# Patient Record
Sex: Female | Born: 1975 | Race: Black or African American | Hispanic: No | Marital: Single | Smoking: Current some day smoker
Health system: Southern US, Community
[De-identification: ages and names within clinical notes are randomized; demographics above are authoritative.]

## PROBLEM LIST (undated history)

## (undated) ENCOUNTER — Inpatient Hospital Stay (HOSPITAL_COMMUNITY): Payer: Self-pay

## (undated) DIAGNOSIS — J45909 Unspecified asthma, uncomplicated: Secondary | ICD-10-CM

## (undated) DIAGNOSIS — F209 Schizophrenia, unspecified: Secondary | ICD-10-CM

## (undated) DIAGNOSIS — F1910 Other psychoactive substance abuse, uncomplicated: Secondary | ICD-10-CM

## (undated) DIAGNOSIS — F319 Bipolar disorder, unspecified: Secondary | ICD-10-CM

## (undated) HISTORY — DX: Unspecified asthma, uncomplicated: J45.909

---

## 1998-01-12 ENCOUNTER — Inpatient Hospital Stay (HOSPITAL_COMMUNITY): Admission: AD | Admit: 1998-01-12 | Discharge: 1998-01-12 | Payer: Self-pay | Admitting: *Deleted

## 1998-05-31 ENCOUNTER — Inpatient Hospital Stay (HOSPITAL_COMMUNITY): Admission: AD | Admit: 1998-05-31 | Discharge: 1998-05-31 | Payer: Self-pay | Admitting: *Deleted

## 1998-06-03 ENCOUNTER — Inpatient Hospital Stay (HOSPITAL_COMMUNITY): Admission: AD | Admit: 1998-06-03 | Discharge: 1998-06-03 | Payer: Self-pay | Admitting: *Deleted

## 1998-06-12 ENCOUNTER — Inpatient Hospital Stay (HOSPITAL_COMMUNITY): Admission: AD | Admit: 1998-06-12 | Discharge: 1998-06-12 | Payer: Self-pay | Admitting: Obstetrics & Gynecology

## 1999-01-29 ENCOUNTER — Encounter: Payer: Self-pay | Admitting: Emergency Medicine

## 1999-01-29 ENCOUNTER — Emergency Department (HOSPITAL_COMMUNITY): Admission: EM | Admit: 1999-01-29 | Discharge: 1999-01-29 | Payer: Self-pay | Admitting: Emergency Medicine

## 1999-02-25 ENCOUNTER — Emergency Department (HOSPITAL_COMMUNITY): Admission: EM | Admit: 1999-02-25 | Discharge: 1999-02-25 | Payer: Self-pay | Admitting: Emergency Medicine

## 1999-07-31 ENCOUNTER — Emergency Department (HOSPITAL_COMMUNITY): Admission: EM | Admit: 1999-07-31 | Discharge: 1999-07-31 | Payer: Self-pay | Admitting: Emergency Medicine

## 1999-10-02 ENCOUNTER — Inpatient Hospital Stay (HOSPITAL_COMMUNITY): Admission: EM | Admit: 1999-10-02 | Discharge: 1999-10-08 | Payer: Self-pay | Admitting: *Deleted

## 2000-03-05 ENCOUNTER — Emergency Department (HOSPITAL_COMMUNITY): Admission: EM | Admit: 2000-03-05 | Discharge: 2000-03-05 | Payer: Self-pay | Admitting: Emergency Medicine

## 2000-08-11 ENCOUNTER — Emergency Department (HOSPITAL_COMMUNITY): Admission: EM | Admit: 2000-08-11 | Discharge: 2000-08-11 | Payer: Self-pay | Admitting: Emergency Medicine

## 2000-09-08 ENCOUNTER — Encounter: Payer: Self-pay | Admitting: Obstetrics & Gynecology

## 2000-09-08 ENCOUNTER — Inpatient Hospital Stay (HOSPITAL_COMMUNITY): Admission: AD | Admit: 2000-09-08 | Discharge: 2000-09-08 | Payer: Self-pay | Admitting: *Deleted

## 2000-09-11 ENCOUNTER — Inpatient Hospital Stay (HOSPITAL_COMMUNITY): Admission: AD | Admit: 2000-09-11 | Discharge: 2000-09-11 | Payer: Self-pay | Admitting: Obstetrics & Gynecology

## 2000-10-26 ENCOUNTER — Inpatient Hospital Stay (HOSPITAL_COMMUNITY): Admission: AD | Admit: 2000-10-26 | Discharge: 2000-10-26 | Payer: Self-pay | Admitting: Obstetrics

## 2002-01-05 ENCOUNTER — Emergency Department (HOSPITAL_COMMUNITY): Admission: EM | Admit: 2002-01-05 | Discharge: 2002-01-05 | Payer: Self-pay | Admitting: Emergency Medicine

## 2002-01-29 ENCOUNTER — Inpatient Hospital Stay (HOSPITAL_COMMUNITY): Admission: EM | Admit: 2002-01-29 | Discharge: 2002-02-06 | Payer: Self-pay | Admitting: Psychiatry

## 2002-08-08 ENCOUNTER — Inpatient Hospital Stay (HOSPITAL_COMMUNITY): Admission: EM | Admit: 2002-08-08 | Discharge: 2002-08-21 | Payer: Self-pay | Admitting: Psychiatry

## 2002-12-04 ENCOUNTER — Inpatient Hospital Stay (HOSPITAL_COMMUNITY): Admission: EM | Admit: 2002-12-04 | Discharge: 2002-12-17 | Payer: Self-pay | Admitting: Psychiatry

## 2010-10-25 HISTORY — PX: BREAST LUMPECTOMY: SHX2

## 2012-03-20 ENCOUNTER — Other Ambulatory Visit: Payer: Self-pay | Admitting: Cardiology

## 2012-06-18 ENCOUNTER — Emergency Department (HOSPITAL_COMMUNITY)
Admission: EM | Admit: 2012-06-18 | Discharge: 2012-06-18 | Disposition: A | Discharge status: Home / Self Care | Payer: Medicare Other | Attending: Emergency Medicine | Admitting: Emergency Medicine

## 2012-06-18 ENCOUNTER — Encounter (HOSPITAL_COMMUNITY): Payer: Self-pay | Admitting: *Deleted

## 2012-06-18 DIAGNOSIS — F319 Bipolar disorder, unspecified: Secondary | ICD-10-CM | POA: Insufficient documentation

## 2012-06-18 DIAGNOSIS — F17200 Nicotine dependence, unspecified, uncomplicated: Secondary | ICD-10-CM | POA: Insufficient documentation

## 2012-06-18 DIAGNOSIS — F209 Schizophrenia, unspecified: Secondary | ICD-10-CM

## 2012-06-18 HISTORY — DX: Bipolar disorder, unspecified: F31.9

## 2012-06-18 HISTORY — DX: Schizophrenia, unspecified: F20.9

## 2012-06-18 LAB — COMPREHENSIVE METABOLIC PANEL
Albumin: 3.4 g/dL — ABNORMAL LOW (ref 3.5–5.2)
Alkaline Phosphatase: 97 U/L (ref 39–117)
BUN: 5 mg/dL — ABNORMAL LOW (ref 6–23)
CO2: 26 mEq/L (ref 19–32)
Chloride: 103 mEq/L (ref 96–112)
Creatinine, Ser: 1.09 mg/dL (ref 0.50–1.10)
GFR calc non Af Amer: 65 mL/min — ABNORMAL LOW (ref >90)
Glucose, Bld: 87 mg/dL (ref 70–99)
Potassium: 3.4 mEq/L — ABNORMAL LOW (ref 3.5–5.1)
Total Bilirubin: 0.5 mg/dL (ref 0.3–1.2)

## 2012-06-18 LAB — RAPID URINE DRUG SCREEN, HOSP PERFORMED
Barbiturates: NOT DETECTED
Benzodiazepines: NOT DETECTED

## 2012-06-18 LAB — CBC
HCT: 37.1 % (ref 36.0–46.0)
Hemoglobin: 12.9 g/dL (ref 12.0–15.0)
MCH: 30.5 pg (ref 26.0–34.0)
MCHC: 34.8 g/dL (ref 30.0–36.0)
MCV: 87.7 fL (ref 78.0–100.0)
Platelets: 186 10*3/uL (ref 150–400)
RBC: 4.23 MIL/uL (ref 3.87–5.11)
RDW: 13.3 % (ref 11.5–15.5)
WBC: 5.7 10*3/uL (ref 4.0–10.5)

## 2012-06-18 LAB — ACETAMINOPHEN LEVEL: Acetaminophen (Tylenol), Serum: <15 ug/mL (ref 10–30)

## 2012-06-18 LAB — ETHANOL: Alcohol, Ethyl (B): <11 mg/dL (ref 0–11)

## 2012-06-18 MED ORDER — ONDANSETRON HCL 4 MG PO TABS: 4.0000 mg | ORAL_TABLET | Freq: Three times a day (TID) | ORAL | Status: DC | PRN

## 2012-06-18 MED ORDER — ALUM & MAG HYDROXIDE-SIMETH 200-200-20 MG/5ML PO SUSP: 30.0000 mL | ORAL | Status: DC | PRN

## 2012-06-18 MED ORDER — ZOLPIDEM TARTRATE 5 MG PO TABS: 5.0000 mg | ORAL_TABLET | Freq: Every evening | ORAL | Status: DC | PRN

## 2012-06-18 MED ORDER — NICOTINE 21 MG/24HR TD PT24
21.0000 mg | MEDICATED_PATCH | Freq: Every day | TRANSDERMAL | Status: DC
  Administered 2012-06-18: 21 mg via TRANSDERMAL
  Filled 2012-06-18: qty 1

## 2012-06-18 MED ORDER — LORAZEPAM 1 MG PO TABS: 1.0000 mg | ORAL_TABLET | Freq: Three times a day (TID) | ORAL | Status: DC | PRN

## 2012-06-18 MED ORDER — IBUPROFEN 200 MG PO TABS: 600.0000 mg | ORAL_TABLET | Freq: Three times a day (TID) | ORAL | Status: DC | PRN

## 2012-06-18 MED ORDER — ACETAMINOPHEN 325 MG PO TABS: 650.0000 mg | ORAL_TABLET | ORAL | Status: DC | PRN

## 2012-06-18 NOTE — ED Provider Notes (Signed)
Medical screening examination/treatment/procedure(s) were performed by non-physician practitioner and as supervising physician I was immediately available for consultation/collaboration.  Krupa Stege, MD 06/18/12 1521 

## 2012-06-18 NOTE — ED Notes (Signed)
Psychiatric Consult Request form faxed to [facility]. Spoke w/Kyle to confirm fax and verify information. Dr. Jetty Peeks is oncall and will give a call back within the hour per [facility].

## 2012-06-18 NOTE — ED Provider Notes (Signed)
Cassidy Thomas is a 36 y.o. female who presented under IVC. She has been seen by [facility], who feels that she has paranoid schizophrenia, but is stable and the she can be followed up as usual with her psychiatrist in the outpatient setting.     Flint Melter, MD 06/18/12 1729

## 2012-06-18 NOTE — ED Notes (Signed)
Pt denies reasons listed on IVC papers and does not know why the police brought her in.

## 2012-06-18 NOTE — ED Provider Notes (Signed)
History     CSN: [id number]  Arrival date & time 06/18/12  0102   First MD Initiated Contact with Patient 06/18/12 1045      Chief Complaint  Patient presents with  . Medical Clearance    (Consider location/radiation/quality/duration/timing/severity/associated sxs/prior treatment) HPI Patient is IVC'ed for psychosis. The patient denies any issues to me and denies any hallucinations. The patient denies suicidal or homicidal ideations.  Past Medical History  Diagnosis Date  . Bipolar 1 disorder   . Schizophrenia     History reviewed. No pertinent past surgical history.  No family history on file.  History  Substance Use Topics  . Smoking status: Current Everyday Smoker  . Smokeless tobacco: Not on file  . Alcohol Use: No    OB History    Grav Para Term Preterm Abortions TAB SAB Ect Mult Living                  Review of Systems All other systems negative except as documented in the HPI. All pertinent positives and negatives as reviewed in the HPI.  Allergies  Review of patient's allergies indicates no known allergies.  Home Medications   Current Outpatient Rx  Name Route Sig Dispense Refill  . DIVALPROEX SODIUM ER 500 MG PO TB24 Oral Take 2,000 mg by mouth at bedtime.    Marland Kitchen ESOMEPRAZOLE MAGNESIUM 40 MG PO CPDR Oral Take 40 mg by mouth daily before breakfast.    . LEVOTHYROXINE SODIUM 75 MCG PO TABS Oral Take 75 mcg by mouth daily.    Marland Kitchen PALIPERIDONE PALMITATE 156 MG/ML IM SUSP Intramuscular Inject 156 mg into the muscle every 30 (thirty) days. Last filled 04-04-12 at pt's pharmacy. Pt states she received samples from md office      BP 123/81  Pulse 81  Temp 98 F (36.7 C) (Oral)  Resp 16  SpO2 97%  LMP 06/12/2012  Physical Exam  Nursing note and vitals reviewed. Constitutional: She is oriented to person, place, and time. She appears well-developed and well-nourished. No distress.  HENT:  Head: Normocephalic and atraumatic.  Mouth/Throat: Oropharynx is  clear and moist.  Eyes: Pupils are equal, round, and reactive to light.  Neck: Normal range of motion. Neck supple.  Cardiovascular: Normal rate and regular rhythm.  Exam reveals no gallop and no friction rub.   No murmur heard. Pulmonary/Chest: Effort normal and breath sounds normal. No respiratory distress.  Neurological: She is alert and oriented to person, place, and time.  Skin: Skin is warm and dry.  Psychiatric: She has a normal mood and affect. Her behavior is normal.    ED Course  Procedures (including critical care time)  Labs Reviewed  COMPREHENSIVE METABOLIC PANEL - Abnormal; Notable for the following:    Potassium 3.4 (*)     BUN 5 (*)     Albumin 3.4 (*)     GFR calc non Af Amer 65 (*)     GFR calc Af Amer 76 (*)     All other components within normal limits  URINE RAPID DRUG SCREEN (HOSP PERFORMED) - Abnormal; Notable for the following:    Cocaine POSITIVE (*)     Tetrahydrocannabinol POSITIVE (*)     All other components within normal limits  CBC  ETHANOL  ACETAMINOPHEN LEVEL   Patient to be assessed by the ACT team.    MDM         Carlyle Dolly, PA-C 06/18/12 1514

## 2012-06-18 NOTE — ED Notes (Signed)
Discharged paperwork reviewed with pt. She verbalized understanding of instructions and the importance of f/u with her outpt psychiatrist this week. Pt denies any pain S/HI at this time.

## 2012-10-02 ENCOUNTER — Encounter (HOSPITAL_COMMUNITY): Payer: Self-pay | Admitting: Emergency Medicine

## 2012-10-02 ENCOUNTER — Emergency Department (HOSPITAL_COMMUNITY)
Admission: EM | Admit: 2012-10-02 | Discharge: 2012-10-02 | Disposition: A | Discharge status: Home / Self Care | Payer: Medicare Other | Attending: Emergency Medicine | Admitting: Emergency Medicine

## 2012-10-02 DIAGNOSIS — Z79899 Other long term (current) drug therapy: Secondary | ICD-10-CM | POA: Insufficient documentation

## 2012-10-02 DIAGNOSIS — F209 Schizophrenia, unspecified: Secondary | ICD-10-CM | POA: Insufficient documentation

## 2012-10-02 DIAGNOSIS — N739 Female pelvic inflammatory disease, unspecified: Secondary | ICD-10-CM

## 2012-10-02 DIAGNOSIS — Z202 Contact with and (suspected) exposure to infections with a predominantly sexual mode of transmission: Secondary | ICD-10-CM | POA: Insufficient documentation

## 2012-10-02 DIAGNOSIS — F17200 Nicotine dependence, unspecified, uncomplicated: Secondary | ICD-10-CM | POA: Insufficient documentation

## 2012-10-02 DIAGNOSIS — Z3202 Encounter for pregnancy test, result negative: Secondary | ICD-10-CM | POA: Insufficient documentation

## 2012-10-02 DIAGNOSIS — F319 Bipolar disorder, unspecified: Secondary | ICD-10-CM | POA: Insufficient documentation

## 2012-10-02 DIAGNOSIS — Z711 Person with feared health complaint in whom no diagnosis is made: Secondary | ICD-10-CM

## 2012-10-02 DIAGNOSIS — N760 Acute vaginitis: Secondary | ICD-10-CM

## 2012-10-02 LAB — WET PREP, GENITAL
Trich, Wet Prep: NONE SEEN
Yeast Wet Prep HPF POC: NONE SEEN

## 2012-10-02 LAB — URINE MICROSCOPIC-ADD ON

## 2012-10-02 LAB — URINALYSIS, ROUTINE W REFLEX MICROSCOPIC
Glucose, UA: NEGATIVE mg/dL
Protein, ur: 30 mg/dL — AB
Specific Gravity, Urine: 1.036 — ABNORMAL HIGH (ref 1.005–1.030)

## 2012-10-02 LAB — POCT PREGNANCY, URINE: Preg Test, Ur: NEGATIVE

## 2012-10-02 MED ORDER — AZITHROMYCIN 250 MG PO TABS
1000.0000 mg | ORAL_TABLET | Freq: Once | ORAL | Status: AC
  Administered 2012-10-02: 1000 mg via ORAL
  Filled 2012-10-02: qty 4

## 2012-10-02 MED ORDER — CEFTRIAXONE SODIUM 1 G IJ SOLR
1.0000 g | Freq: Once | INTRAMUSCULAR | Status: AC
  Administered 2012-10-02: 1 g via INTRAMUSCULAR
  Filled 2012-10-02: qty 10

## 2012-10-02 MED ORDER — METRONIDAZOLE 500 MG PO TABS: 500.0000 mg | ORAL_TABLET | Freq: Two times a day (BID) | ORAL | Status: DC

## 2012-10-02 NOTE — ED Provider Notes (Signed)
Medical screening examination/treatment/procedure(s) were performed by non-physician practitioner and as supervising physician I was immediately available for consultation/collaboration.  Urania Pearlman R. Kaleab Frasier, MD 10/02/12 1559 

## 2012-10-02 NOTE — ED Provider Notes (Signed)
History     CSN: [id number]  Arrival date & time 10/02/12  1005   First MD Initiated Contact with Patient 10/02/12 1059      Chief Complaint  Patient presents with  . Exposure to STD  . Vaginal Bleeding    (Consider location/radiation/quality/duration/timing/severity/associated sxs/prior treatment) HPI Comments:  36 year old female presents to the emergency department complaining of vaginal bleeding for the past 2 days. States it is "a lot of blood", however the blood is only present on her underpants. Denies abdominal pain, vaginal pain or discharge. No fever, chills, nausea or vomiting. She is sexually active with one partner who recently had intercourse with another female. Only uses protection he occasionally. Not on any birth control at this time. Last menstrual period was 2 weeks ago and was normal. She is concerned for possible exposure to sexually transmitted diseases.  Patient is a 36 y.o. female presenting with STD exposure and vaginal bleeding. The history is provided by the patient.  Exposure to STD Pertinent negatives include no abdominal pain, chest pain, chills, fever, nausea, vomiting or weakness.  Vaginal Bleeding Pertinent negatives include no abdominal pain, chest pain, chills, fever, nausea, vomiting or weakness.    Past Medical History  Diagnosis Date  . Bipolar 1 disorder   . Schizophrenia     History reviewed. No pertinent past surgical history.  History reviewed. No pertinent family history.  History  Substance Use Topics  . Smoking status: Current Every Day Smoker  . Smokeless tobacco: Not on file  . Alcohol Use: No    OB History    Grav Para Term Preterm Abortions TAB SAB Ect Mult Living                  Review of Systems  Constitutional: Negative for fever and chills.  HENT: Negative.   Respiratory: Negative for shortness of breath.   Cardiovascular: Negative for chest pain.  Gastrointestinal: Negative for nausea, vomiting and abdominal  pain.  Genitourinary: Positive for vaginal bleeding. Negative for dysuria, vaginal discharge, vaginal pain and menstrual problem.  Musculoskeletal: Negative for back pain.  Skin: Negative.   Neurological: Negative for weakness.  Psychiatric/Behavioral: The patient is not nervous/anxious.     Allergies  Review of patient's allergies indicates no known allergies.  Home Medications   Current Outpatient Rx  Name  Route  Sig  Dispense  Refill  . DIVALPROEX SODIUM ER 500 MG PO TB24   Oral   Take 2,000 mg by mouth at bedtime.         Marland Kitchen ESOMEPRAZOLE MAGNESIUM 40 MG PO CPDR   Oral   Take 40 mg by mouth daily before breakfast.         . LEVOTHYROXINE SODIUM 75 MCG PO TABS   Oral   Take 75 mcg by mouth daily.         Marland Kitchen PALIPERIDONE PALMITATE 156 MG/ML IM SUSP   Intramuscular   Inject 156 mg into the muscle every 30 (thirty) days. Last filled 04-04-12 at pt's pharmacy. Pt states she received samples from md office           BP 122/87  Pulse 79  Temp 97.7 F (36.5 C) (Oral)  Resp 18  SpO2 96%  Physical Exam  Constitutional: She is oriented to person, place, and time. She appears well-developed and well-nourished. No distress.  HENT:  Head: Normocephalic and atraumatic.  Mouth/Throat: Oropharynx is clear and moist.  Eyes: Conjunctivae normal are normal.  Neck: Normal range of  motion. Neck supple.  Cardiovascular: Normal rate, regular rhythm and normal heart sounds.   Pulmonary/Chest: Effort normal and breath sounds normal.  Abdominal: Soft. Normal appearance and bowel sounds are normal. There is generalized tenderness (mild). There is no guarding.  Genitourinary: Uterus normal. Cervix exhibits motion tenderness and discharge. Cervix exhibits no friability. Right adnexum displays no mass, no tenderness and no fullness. Left adnexum displays no mass, no tenderness and no fullness. No erythema, tenderness or bleeding around the vagina. Vaginal discharge (copious, dark yellow,  foul odor) found.  Musculoskeletal: Normal range of motion. She exhibits no edema.  Neurological: She is alert and oriented to person, place, and time.  Skin: Skin is warm and dry.  Psychiatric: She has a normal mood and affect. Her behavior is normal.    ED Course  Procedures (including critical care time)  Labs Reviewed  WET PREP, GENITAL - Abnormal; Notable for the following:    Clue Cells Wet Prep HPF POC FEW (*)     WBC, Wet Prep HPF POC FEW (*)     All other components within normal limits  URINALYSIS, ROUTINE W REFLEX MICROSCOPIC - Abnormal; Notable for the following:    Color, Urine ORANGE (*)  BIOCHEMICALS MAY BE AFFECTED BY COLOR   APPearance HAZY (*)     Specific Gravity, Urine 1.036 (*)     Hgb urine dipstick MODERATE (*)     Bilirubin Urine LARGE (*)     Ketones, ur 40 (*)     Protein, ur 30 (*)     Urobilinogen, UA 2.0 (*)     Leukocytes, UA TRACE (*)     All other components within normal limits  URINE MICROSCOPIC-ADD ON - Abnormal; Notable for the following:    Squamous Epithelial / LPF FEW (*)     Bacteria, UA FEW (*)     All other components within normal limits  POCT PREGNANCY, URINE  GC/CHLAMYDIA PROBE AMP   No results found.   1. PID (pelvic inflammatory disease)   2. Vaginitis   3. Concern about STD in female without diagnosis       MDM  36 y/o female with PID and vaginitis. CMT on exam. Foul smelling dark yellow discharge present. 1g IM rocephin and 4g PO azithromycin given. Rx flagyl for vaginitis. No signs on exam concerning pelvic abscess. Vitals stable and patient is in NAD. Return precautions discussed.       Trevor Mace, PA-C 10/02/12 1336

## 2012-10-02 NOTE — ED Notes (Signed)
Pt c/o vaginal bleeding and possible STD exposure; pt requesting testing

## 2012-10-03 LAB — GC/CHLAMYDIA PROBE AMP: CT Probe RNA: NEGATIVE

## 2013-05-28 ENCOUNTER — Encounter: Payer: Medicare Other | Admitting: Obstetrics & Gynecology

## 2013-05-31 ENCOUNTER — Ambulatory Visit (HOSPITAL_COMMUNITY)
Admission: RE | Admit: 2013-05-31 | Discharge: 2013-05-31 | Disposition: A | Discharge status: Home / Self Care | Payer: Medicare Other | Source: Ambulatory Visit | Attending: Family Medicine | Admitting: Family Medicine

## 2013-05-31 ENCOUNTER — Ambulatory Visit (INDEPENDENT_AMBULATORY_CARE_PROVIDER_SITE_OTHER): Payer: Medicare Other | Admitting: Family Medicine

## 2013-05-31 ENCOUNTER — Other Ambulatory Visit (HOSPITAL_COMMUNITY)
Admission: RE | Admit: 2013-05-31 | Discharge: 2013-05-31 | Disposition: A | Discharge status: Home / Self Care | Payer: Medicare Other | Source: Ambulatory Visit | Attending: Family Medicine | Admitting: Family Medicine

## 2013-05-31 ENCOUNTER — Encounter: Payer: Self-pay | Admitting: Family Medicine

## 2013-05-31 VITALS — BP 106/64 | Temp 96.8°F | Wt 134.0 lb

## 2013-05-31 DIAGNOSIS — Z124 Encounter for screening for malignant neoplasm of cervix: Secondary | ICD-10-CM | POA: Insufficient documentation

## 2013-05-31 DIAGNOSIS — Z1151 Encounter for screening for human papillomavirus (HPV): Secondary | ICD-10-CM | POA: Insufficient documentation

## 2013-05-31 DIAGNOSIS — Z113 Encounter for screening for infections with a predominantly sexual mode of transmission: Secondary | ICD-10-CM | POA: Insufficient documentation

## 2013-05-31 DIAGNOSIS — Z3689 Encounter for other specified antenatal screening: Secondary | ICD-10-CM | POA: Insufficient documentation

## 2013-05-31 DIAGNOSIS — O99340 Other mental disorders complicating pregnancy, unspecified trimester: Secondary | ICD-10-CM

## 2013-05-31 DIAGNOSIS — O09521 Supervision of elderly multigravida, first trimester: Secondary | ICD-10-CM

## 2013-05-31 DIAGNOSIS — O3680X0 Pregnancy with inconclusive fetal viability, not applicable or unspecified: Secondary | ICD-10-CM

## 2013-05-31 DIAGNOSIS — O09529 Supervision of elderly multigravida, unspecified trimester: Secondary | ICD-10-CM | POA: Insufficient documentation

## 2013-05-31 DIAGNOSIS — F209 Schizophrenia, unspecified: Secondary | ICD-10-CM

## 2013-05-31 DIAGNOSIS — O0990 Supervision of high risk pregnancy, unspecified, unspecified trimester: Secondary | ICD-10-CM | POA: Insufficient documentation

## 2013-05-31 LAB — POCT URINALYSIS DIP (DEVICE)
Bilirubin Urine: NEGATIVE
Glucose, UA: NEGATIVE mg/dL
Hgb urine dipstick: NEGATIVE
Ketones, ur: NEGATIVE mg/dL
Leukocytes, UA: NEGATIVE
Nitrite: NEGATIVE
Protein, ur: NEGATIVE mg/dL
Specific Gravity, Urine: 1.02 (ref 1.005–1.030)
Urobilinogen, UA: 2 mg/dL — ABNORMAL HIGH (ref 0.0–1.0)
pH: 7.5 (ref 5.0–8.0)

## 2013-05-31 LAB — HIV ANTIBODY (ROUTINE TESTING W REFLEX): HIV: NONREACTIVE

## 2013-05-31 LAB — TSH: TSH: 2.29 u[IU]/mL (ref 0.350–4.500)

## 2013-05-31 NOTE — Progress Notes (Signed)
Nutrition Note:  1st visit Wt. Loss of 26# during pregnancy.  Unsure of EDC, ultrasound scheduled for this afternoon. Pt. Reports eating 1-2 meals/d.  No N/V.  Drinks [place] daily. Discussed importance of eating 3 meals and 2-3 snacks/d. And of adequate wt. Gain during pregnancy. Certified for [facility] today. F/U in 2-3 weeks. Candice C. Earlene Plater, MPH, RD, LDN

## 2013-05-31 NOTE — Patient Instructions (Signed)
Pregnancy - First Trimester  During sexual intercourse, millions of sperm go into the vagina. Only 1 sperm will penetrate and fertilize the female egg while it is in the Fallopian tube. One week later, the fertilized egg implants into the wall of the uterus. An embryo begins to develop into a baby. At 6 to 8 weeks, the eyes and face are formed and the heartbeat can be seen on ultrasound. At the end of 12 weeks (first trimester), all the baby's organs are formed. Now that you are pregnant, you will want to do everything you can to have a healthy baby. Two of the most important things are to get good prenatal care and follow your caregiver's instructions. Prenatal care is all the medical care you receive before the baby's birth. It is given to prevent, find, and treat problems during the pregnancy and childbirth.  PRENATAL EXAMS  · During prenatal visits, your weight, blood pressure, and urine are checked. This is done to make sure you are healthy and progressing normally during the pregnancy.  · A pregnant woman should gain 25 to 35 pounds during the pregnancy. However, if you are overweight or underweight, your caregiver will advise you regarding your weight.  · Your caregiver will ask and answer questions for you.  · Blood work, cervical cultures, other necessary tests, and a Pap test are done during your prenatal exams. These tests are done to check on your health and the probable health of your baby. Tests are strongly recommended and done for HIV with your permission. This is the virus that causes AIDS. These tests are done because medicines can be given to help prevent your baby from being born with this infection should you have been infected without knowing it. Blood work is also used to find out your blood type, previous infections, and follow your blood levels (hemoglobin).  · Low hemoglobin (anemia) is common during pregnancy. Iron and vitamins are given to help prevent this. Later in the pregnancy, blood  tests for diabetes will be done along with any other tests if any problems develop.  · You may need other tests to make sure you and the baby are doing well.  CHANGES DURING THE FIRST TRIMESTER   Your body goes through many changes during pregnancy. They vary from person to person. Talk to your caregiver about changes you notice and are concerned about. Changes can include:  · Your menstrual period stops.  · The egg and sperm carry the genes that determine what you look like. Genes from you and your partner are forming a baby. The female genes determine whether the baby is a boy or a girl.  · Your body increases in girth and you may feel bloated.  · Feeling sick to your stomach (nauseous) and throwing up (vomiting). If the vomiting is uncontrollable, call your caregiver.  · Your breasts will begin to enlarge and become tender.  · Your nipples may stick out more and become darker.  · The need to urinate more. Painful urination may mean you have a bladder infection.  · Tiring easily.  · Loss of appetite.  · Cravings for certain kinds of food.  · At first, you may gain or lose a couple of pounds.  · You may have changes in your emotions from day to day (excited to be pregnant or concerned something may go wrong with the pregnancy and baby).  · You may have more vivid and strange dreams.  HOME CARE INSTRUCTIONS   ·   It is very important to avoid all smoking, alcohol and non-prescribed drugs during your pregnancy. These affect the formation and growth of the baby. Avoid chemicals while pregnant to ensure the delivery of a healthy infant.  · Start your prenatal visits by the 12th week of pregnancy. They are usually scheduled monthly at first, then more often in the last 2 months before delivery. Keep your caregiver's appointments. Follow your caregiver's instructions regarding medicine use, blood and lab tests, exercise, and diet.  · During pregnancy, you are providing food for you and your baby. Eat regular, well-balanced  meals. Choose foods such as meat, fish, milk and other low fat dairy products, vegetables, fruits, and whole-grain breads and cereals. Your caregiver will tell you of the ideal weight gain.  · You can help morning sickness by keeping soda crackers at the bedside. Eat a couple before arising in the morning. You may want to use the crackers without salt on them.  · Eating 4 to 5 small meals rather than 3 large meals a day also may help the nausea and vomiting.  · Drinking liquids between meals instead of during meals also seems to help nausea and vomiting.  · A physical sexual relationship may be continued throughout pregnancy if there are no other problems. Problems may be early (premature) leaking of amniotic fluid from the membranes, vaginal bleeding, or belly (abdominal) pain.  · Exercise regularly if there are no restrictions. Check with your caregiver or physical therapist if you are unsure of the safety of some of your exercises. Greater weight gain will occur in the last 2 trimesters of pregnancy. Exercising will help:  · Control your weight.  · Keep you in shape.  · Prepare you for labor and delivery.  · Help you lose your pregnancy weight after you deliver your baby.  · Wear a good support or jogging bra for breast tenderness during pregnancy. This may help if worn during sleep too.  · Ask when prenatal classes are available. Begin classes when they are offered.  · Do not use hot tubs, steam rooms, or saunas.  · Wear your seat belt when driving. This protects you and your baby if you are in an accident.  · Avoid raw meat, uncooked cheese, cat litter boxes, and soil used by cats throughout the pregnancy. These carry germs that can cause birth defects in the baby.  · The first trimester is a good time to visit your dentist for your dental health. Getting your teeth cleaned is okay. Use a softer toothbrush and brush gently during pregnancy.  · Ask for help if you have financial, counseling, or nutritional needs  during pregnancy. Your caregiver will be able to offer counseling for these needs as well as refer you for other special needs.  · Do not take any medicines or herbs unless told by your caregiver.  · Inform your caregiver if there is any mental or physical domestic violence.  · Make a list of emergency phone numbers of family, friends, hospital, and police and fire departments.  · Write down your questions. Take them to your prenatal visit.  · Do not douche.  · Do not cross your legs.  · If you have to stand for long periods of time, rotate you feet or take small steps in a circle.  · You may have more vaginal secretions that may require a sanitary pad. Do not use tampons or scented sanitary pads.  MEDICINES AND DRUG USE IN PREGNANCY  ·   Take prenatal vitamins as directed. The vitamin should contain 1 milligram of folic acid. Keep all vitamins out of reach of children. Only a couple vitamins or tablets containing iron may be fatal to a baby or young child when ingested.  · Avoid use of all medicines, including herbs, over-the-counter medicines, not prescribed or suggested by your caregiver. Only take over-the-counter or prescription medicines for pain, discomfort, or fever as directed by your caregiver. Do not use aspirin, ibuprofen, or naproxen unless directed by your caregiver.  · Let your caregiver also know about herbs you may be using.  · Alcohol is related to a number of birth defects. This includes fetal alcohol syndrome. All alcohol, in any form, should be avoided completely. Smoking will cause low birth rate and premature babies.  · Street or illegal drugs are very harmful to the baby. They are absolutely forbidden. A baby born to an addicted mother will be addicted at birth. The baby will go through the same withdrawal an adult does.  · Let your caregiver know about any medicines that you have to take and for what reason you take them.  SEEK MEDICAL CARE IF:   You have any concerns or worries during your  pregnancy. It is better to call with your questions if you feel they cannot wait, rather than worry about them.  SEEK IMMEDIATE MEDICAL CARE IF:   · An unexplained oral temperature above 102° F (38.9° C) develops, or as your caregiver suggests.  · You have leaking of fluid from the vagina (birth canal). If leaking membranes are suspected, take your temperature and inform your caregiver of this when you call.  · There is vaginal spotting or bleeding. Notify your caregiver of the amount and how many pads are used.  · You develop a bad smelling vaginal discharge with a change in the color.  · You continue to feel sick to your stomach (nauseated) and have no relief from remedies suggested. You vomit blood or coffee ground-like materials.  · You lose more than 2 pounds of weight in 1 week.  · You gain more than 2 pounds of weight in 1 week and you notice swelling of your face, hands, feet, or legs.  · You gain 5 pounds or more in 1 week (even if you do not have swelling of your hands, face, legs, or feet).  · You get exposed to German measles and have never had them.  · You are exposed to fifth disease or chickenpox.  · You develop belly (abdominal) pain. Round ligament discomfort is a common non-cancerous (benign) cause of abdominal pain in pregnancy. Your caregiver still must evaluate this.  · You develop headache, fever, diarrhea, pain with urination, or shortness of breath.  · You fall or are in a car accident or have any kind of trauma.  · There is mental or physical violence in your home.  Document Released: 10/05/2001 Document Revised: 07/05/2012 Document Reviewed: 04/08/2009  [employer]® Patient Information ©2014 [employer].

## 2013-05-31 NOTE — Progress Notes (Signed)
Pulse: 77

## 2013-05-31 NOTE — Progress Notes (Signed)
   Subjective:    Cassidy Thomas is a G2P1001 Unknown being seen today for her first obstetrical visit.  Her obstetrical history is significant for advanced maternal age and schizophrenia.  She is accompanied by her therapist today. She is on risperidal 1 mg daily.  Pregnancy history fully reviewed.  Patient reports no complaints.  Filed Vitals:   05/31/13 1028  BP: 106/64  Temp: 96.8 F (36 C)  Weight: 134 lb (60.782 kg)    HISTORY: OB History   Grav Para Term Preterm Abortions TAB SAB Ect Mult Living   2 1 1  0 0 0 0 0 0 1     # Outc Date GA Lbr Len/2nd Wgt Sex Del Anes PTL Lv   1 TRM 2/02   7lb(3.175kg) M SVD EPI  Yes   2 CUR              Past Medical History  Diagnosis Date  . Bipolar 1 disorder   . Schizophrenia   . Asthma    Past Surgical History  Procedure Laterality Date  . Breast lumpectomy  2012    right breast   Family History  Problem Relation Age of Onset  . Cancer Maternal Grandmother      Exam    Uterus: 10 wk size    Pelvic Exam:    Perineum: Normal Perineum   Vulva: Bartholin's, Urethra, Skene's normal   Vagina:  normal mucosa, normal discharge   Cervix: multiparous appearance   Adnexa: normal adnexa and no mass, fullness, tenderness   Bony Pelvis: average  System: Breast:  normal appearance, no masses or tenderness   Skin: normal coloration and turgor, no rashes    Neurologic: oriented   Extremities: normal strength, tone, and muscle mass   HEENT sclera clear, anicteric   Mouth/Teeth mucous membranes moist, pharynx normal without lesions   Neck supple   Cardiovascular: regular rate and rhythm, no murmurs or gallops   Respiratory:  appears well, vitals normal, no respiratory distress, acyanotic, normal RR, ear and throat exam is normal, neck free of mass or lymphadenopathy, chest clear, no wheezing, crepitations, rhonchi, normal symmetric air entry   Abdomen: soft, non-tender; bowel sounds normal; no masses,  no organomegaly       Assessment:    Pregnancy: G2P1001 Patient Active Problem List   Diagnosis Date Noted  . Supervision of high-risk pregnancy 05/31/2013    Priority: High  . AMA (advanced maternal age) multigravida 35+ 05/31/2013    Priority: Medium  . Schizophrenia 05/31/2013        Plan:     Initial labs drawn. Prenatal vitamins. Problem list reviewed and updated. Genetic Screening discussed First Screen: declined. All other genetics declined.  Ultrasound discussed; fetal survey: discussed.  Follow up in 4 weeks. New OB labs.  For U/S for dating and viability today.  Nevyn Bossman S 05/31/2013

## 2013-06-01 ENCOUNTER — Encounter: Payer: Self-pay | Admitting: *Deleted

## 2013-06-01 DIAGNOSIS — O99340 Other mental disorders complicating pregnancy, unspecified trimester: Secondary | ICD-10-CM | POA: Insufficient documentation

## 2013-06-01 LAB — OBSTETRIC PANEL
Antibody Screen: NEGATIVE
Basophils Absolute: 0 10*3/uL (ref 0.0–0.1)
Basophils Relative: 1 % (ref 0–1)
Eosinophils Relative: 3 % (ref 0–5)
HCT: 34.7 % — ABNORMAL LOW (ref 36.0–46.0)
Lymphocytes Relative: 45 % (ref 12–46)
MCHC: 33.7 g/dL (ref 30.0–36.0)
Monocytes Absolute: 0.3 10*3/uL (ref 0.1–1.0)
Neutro Abs: 2.1 10*3/uL (ref 1.7–7.7)
Platelets: 236 10*3/uL (ref 150–400)
RDW: 14.2 % (ref 11.5–15.5)
RPR Ser Ql: REACTIVE — AB
WBC: 4.6 10*3/uL (ref 4.0–10.5)

## 2013-06-01 LAB — CULTURE, OB URINE: Colony Count: 80000

## 2013-06-01 LAB — T.PALLIDUM AB, TOTAL: T pallidum Antibodies (TP-PA): >8 S/CO — ABNORMAL HIGH (ref <0.90)

## 2013-06-04 LAB — HEMOGLOBINOPATHY EVALUATION: Hemoglobin Other: 0 %

## 2013-06-28 ENCOUNTER — Ambulatory Visit (INDEPENDENT_AMBULATORY_CARE_PROVIDER_SITE_OTHER): Payer: Medicare Other | Admitting: Family Medicine

## 2013-06-28 ENCOUNTER — Other Ambulatory Visit: Payer: Self-pay | Admitting: Family Medicine

## 2013-06-28 VITALS — BP 119/71 | Temp 97.6°F | Wt 140.4 lb

## 2013-06-28 DIAGNOSIS — O98112 Syphilis complicating pregnancy, second trimester: Secondary | ICD-10-CM

## 2013-06-28 DIAGNOSIS — O99320 Drug use complicating pregnancy, unspecified trimester: Secondary | ICD-10-CM | POA: Insufficient documentation

## 2013-06-28 DIAGNOSIS — O99322 Drug use complicating pregnancy, second trimester: Secondary | ICD-10-CM

## 2013-06-28 DIAGNOSIS — O09522 Supervision of elderly multigravida, second trimester: Secondary | ICD-10-CM

## 2013-06-28 DIAGNOSIS — O09529 Supervision of elderly multigravida, unspecified trimester: Secondary | ICD-10-CM

## 2013-06-28 DIAGNOSIS — F1920 Other psychoactive substance dependence, uncomplicated: Secondary | ICD-10-CM

## 2013-06-28 DIAGNOSIS — O98119 Syphilis complicating pregnancy, unspecified trimester: Secondary | ICD-10-CM

## 2013-06-28 MED ORDER — PENICILLIN G BENZATHINE 1200000 UNIT/2ML IM SUSP
2.4000 10*6.[IU] | INTRAMUSCULAR | Status: AC
  Administered 2013-06-28 – 2013-07-12 (×3): 2.4 10*6.[IU] via INTRAMUSCULAR

## 2013-06-28 NOTE — Progress Notes (Signed)
Addressed at visit on 06/28/13.

## 2013-06-28 NOTE — Progress Notes (Signed)
Pulse- 112  Edema-feet

## 2013-06-28 NOTE — Patient Instructions (Addendum)
Pregnancy - Second Trimester The second trimester of pregnancy (3 to 6 months) is a period of rapid growth for you and your baby. At the end of the sixth month, your baby is about 9 inches long and weighs 1 1/2 pounds. You will begin to feel the baby move between 18 and 20 weeks of the pregnancy. This is called quickening. Weight gain is faster. A clear fluid (colostrum) may leak out of your breasts. You may feel small contractions of the womb (uterus). This is known as false labor or Braxton-Hicks contractions. This is like a practice for labor when the baby is ready to be born. Usually, the problems with morning sickness have usually passed by the end of your first trimester. Some women develop small dark blotches (called cholasma, mask of pregnancy) on their face that usually goes away after the baby is born. Exposure to the sun makes the blotches worse. Acne may also develop in some pregnant women and pregnant women who have acne, may find that it goes away. PRENATAL EXAMS  Blood work may continue to be done during prenatal exams. These tests are done to check on your health and the probable health of your baby. Blood work is used to follow your blood levels (hemoglobin). Anemia (low hemoglobin) is common during pregnancy. Iron and vitamins are given to help prevent this. You will also be checked for diabetes between 24 and 28 weeks of the pregnancy. Some of the previous blood tests may be repeated.  The size of the uterus is measured during each visit. This is to make sure that the baby is continuing to grow properly according to the dates of the pregnancy.  Your blood pressure is checked every prenatal visit. This is to make sure you are not getting toxemia.  Your urine is checked to make sure you do not have an infection, diabetes or protein in the urine.  Your weight is checked often to make sure gains are happening at the suggested rate. This is to ensure that both you and your baby are  growing normally.  Sometimes, an ultrasound is performed to confirm the proper growth and development of the baby. This is a test which bounces harmless sound waves off the baby so your caregiver can more accurately determine due dates. Sometimes, a test is done on the amniotic fluid surrounding the baby. This test is called an amniocentesis. The amniotic fluid is obtained by sticking a needle into the belly (abdomen). This is done to check the chromosomes in instances where there is a concern about possible genetic problems with the baby. It is also sometimes done near the end of pregnancy if an early delivery is required. In this case, it is done to help make sure the baby's lungs are mature enough for the baby to live outside of the womb. CHANGES OCCURING IN THE SECOND TRIMESTER OF PREGNANCY Your body goes through many changes during pregnancy. They vary from person to person. Talk to your caregiver about changes you notice that you are concerned about.  During the second trimester, you will likely have an increase in your appetite. It is normal to have cravings for certain foods. This varies from person to person and pregnancy to pregnancy.  Your lower abdomen will begin to bulge.  You may have to urinate more often because the uterus and baby are pressing on your bladder. It is also common to get more bladder infections during pregnancy. You can help this by drinking lots of fluids   and emptying your bladder before and after intercourse.  You may begin to get stretch marks on your hips, abdomen, and breasts. These are normal changes in the body during pregnancy. There are no exercises or medicines to take that prevent this change.  You may begin to develop swollen and bulging veins (varicose veins) in your legs. Wearing support hose, elevating your feet for 15 minutes, 3 to 4 times a day and limiting salt in your diet helps lessen the problem.  Heartburn may develop as the uterus grows and  pushes up against the stomach. Antacids recommended by your caregiver helps with this problem. Also, eating smaller meals 4 to 5 times a day helps.  Constipation can be treated with a stool softener or adding bulk to your diet. Drinking lots of fluids, and eating vegetables, fruits, and whole grains are helpful.  Exercising is also helpful. If you have been very active up until your pregnancy, most of these activities can be continued during your pregnancy. If you have been less active, it is helpful to start an exercise program such as walking.  Hemorrhoids may develop at the end of the second trimester. Warm sitz baths and hemorrhoid cream recommended by your caregiver helps hemorrhoid problems.  Backaches may develop during this time of your pregnancy. Avoid heavy lifting, wear low heal shoes, and practice good posture to help with backache problems.  Some pregnant women develop tingling and numbness of their hand and fingers because of swelling and tightening of ligaments in the wrist (carpel tunnel syndrome). This goes away after the baby is born.  As your breasts enlarge, you may have to get a bigger bra. Get a comfortable, cotton, support bra. Do not get a nursing bra until the last month of the pregnancy if you will be nursing the baby.  You may get a dark line from your belly button to the pubic area called the linea nigra.  You may develop rosy cheeks because of increase blood flow to the face.  You may develop spider looking lines of the face, neck, arms, and chest. These go away after the baby is born. HOME CARE INSTRUCTIONS   It is extremely important to avoid all smoking, herbs, alcohol, and unprescribed drugs during your pregnancy. These chemicals affect the formation and growth of the baby. Avoid these chemicals throughout the pregnancy to ensure the delivery of a healthy infant.  Most of your home care instructions are the same as suggested for the first trimester of your  pregnancy. Keep your caregiver's appointments. Follow your caregiver's instructions regarding medicine use, exercise, and diet.  During pregnancy, you are providing food for you and your baby. Continue to eat regular, well-balanced meals. Choose foods such as meat, fish, milk and other low fat dairy products, vegetables, fruits, and whole-grain breads and cereals. Your caregiver will tell you of the ideal weight gain.  A physical sexual relationship may be continued up until near the end of pregnancy if there are no other problems. Problems could include early (premature) leaking of amniotic fluid from the membranes, vaginal bleeding, abdominal pain, or other medical or pregnancy problems.  Exercise regularly if there are no restrictions. Check with your caregiver if you are unsure of the safety of some of your exercises. The greatest weight gain will occur in the last 2 trimesters of pregnancy. Exercise will help you:  Control your weight.  Get you in shape for labor and delivery.  Lose weight after you have the baby.  Wear   a good support or jogging bra for breast tenderness during pregnancy. This may help if worn during sleep. Pads or tissues may be used in the bra if you are leaking colostrum.  Do not use hot tubs, steam rooms or saunas throughout the pregnancy.  Wear your seat belt at all times when driving. This protects you and your baby if you are in an accident.  Avoid raw meat, uncooked cheese, cat litter boxes, and soil used by cats. These carry germs that can cause birth defects in the baby.  The second trimester is also a good time to visit your dentist for your dental health if this has not been done yet. Getting your teeth cleaned is okay. Use a soft toothbrush. Brush gently during pregnancy.  It is easier to leak urine during pregnancy. Tightening up and strengthening the pelvic muscles will help with this problem. Practice stopping your urination while you are going to the  bathroom. These are the same muscles you need to strengthen. It is also the muscles you would use as if you were trying to stop from passing gas. You can practice tightening these muscles up 10 times a set and repeating this about 3 times per day. Once you know what muscles to tighten up, do not perform these exercises during urination. It is more likely to contribute to an infection by backing up the urine.  Ask for help if you have financial, counseling, or nutritional needs during pregnancy. Your caregiver will be able to offer counseling for these needs as well as refer you for other special needs.  Your skin may become oily. If so, wash your face with mild soap, use non-greasy moisturizer and oil or cream based makeup. MEDICINES AND DRUG USE IN PREGNANCY  Take prenatal vitamins as directed. The vitamin should contain 1 milligram of folic acid. Keep all vitamins out of reach of children. Only a couple vitamins or tablets containing iron may be fatal to a baby or young child when ingested.  Avoid use of all medicines, including herbs, over-the-counter medicines, not prescribed or suggested by your caregiver. Only take over-the-counter or prescription medicines for pain, discomfort, or fever as directed by your caregiver. Do not use aspirin.  Let your caregiver also know about herbs you may be using.  Alcohol is related to a number of birth defects. This includes fetal alcohol syndrome. All alcohol, in any form, should be avoided completely. Smoking will cause low birth rate and premature babies.  Street or illegal drugs are very harmful to the baby. They are absolutely forbidden. A baby born to an addicted mother will be addicted at birth. The baby will go through the same withdrawal an adult does. SEEK MEDICAL CARE IF:  You have any concerns or worries during your pregnancy. It is better to call with your questions if you feel they cannot wait, rather than worry about them. SEEK IMMEDIATE  MEDICAL CARE IF:   An unexplained oral temperature above 102 F (38.9 C) develops, or as your caregiver suggests.  You have leaking of fluid from the vagina (birth canal). If leaking membranes are suspected, take your temperature and tell your caregiver of this when you call.  There is vaginal spotting, bleeding, or passing clots. Tell your caregiver of the amount and how many pads are used. Light spotting in pregnancy is common, especially following intercourse.  You develop a bad smelling vaginal discharge with a change in the color from clear to white.  You continue to feel   sick to your stomach (nauseated) and have no relief from remedies suggested. You vomit blood or coffee ground-like materials.  You lose more than 2 pounds of weight or gain more than 2 pounds of weight over 1 week, or as suggested by your caregiver.  You notice swelling of your face, hands, feet, or legs.  You get exposed to German measles and have never had them.  You are exposed to fifth disease or chickenpox.  You develop belly (abdominal) pain. Round ligament discomfort is a common non-cancerous (benign) cause of abdominal pain in pregnancy. Your caregiver still must evaluate you.  You develop a bad headache that does not go away.  You develop fever, diarrhea, pain with urination, or shortness of breath.  You develop visual problems, blurry, or double vision.  You fall or are in a car accident or any kind of trauma.  There is mental or physical violence at home. Document Released: 10/05/2001 Document Revised: 07/05/2012 Document Reviewed: 04/09/2009 [employer] Patient Information 2014 [employer].  Breastfeeding A change in hormones during your pregnancy causes growth of your breast tissue and an increase in number and size of milk ducts. The hormone prolactin allows proteins, sugars, and fats from your blood supply to make breast milk in your milk-producing glands. The hormone progesterone prevents  breast milk from being released before the birth of your baby. After the birth of your baby, your progesterone level decreases allowing breast milk to be released. Thoughts of your baby, as well as his or her sucking or crying, can stimulate the release of milk from the milk-producing glands. Deciding to breastfeed (nurse) is one of the best choices you can make for you and your baby. The information that follows gives a brief review of the benefits, as well as other important skills to know about breastfeeding. BENEFITS OF BREASTFEEDING For your baby  The first milk (colostrum) helps your baby's digestive system function better.   There are antibodies in your milk that help your baby fight off infections.   Your baby has a lower incidence of asthma, allergies, and sudden infant death syndrome (SIDS).   The nutrients in breast milk are better for your baby than infant formulas.  Breast milk improves your baby's brain development.   Your baby will have less gas, colic, and constipation.  Your baby is less likely to develop other conditions, such as childhood obesity, asthma, or diabetes mellitus. For you  Breastfeeding helps develop a very special bond between you and your baby.   Breastfeeding is convenient, always available at the correct temperature, and costs nothing.   Breastfeeding helps to burn calories and helps you lose the weight gained during pregnancy.   Breastfeeding makes your uterus contract back down to normal size faster and slows bleeding following delivery.   Breastfeeding mothers have a lower risk of developing osteoporosis or breast or ovarian cancer later in life.  BREASTFEEDING FREQUENCY  A healthy, full-term baby may breastfeed as often as every hour or space his or her feedings to every 3 hours. Breastfeeding frequency will vary from baby to baby.   Newborns should be fed no less than every 2 3 hours during the day and every 4 5 hours during the  night. You should breastfeed a minimum of 8 feedings in a 24 hour period.  Awaken your baby to breastfeed if it has been 3 4 hours since the last feeding.  Breastfeed when you feel the need to reduce the fullness of your breasts or when   your newborn shows signs of hunger. Signs that your baby may be hungry include:  Increased alertness or activity.  Stretching.  Movement of the head from side to side.  Movement of the head and opening of the mouth when the corner of the mouth or cheek is stroked (rooting).  Increased sucking sounds, smacking lips, cooing, sighing, or squeaking.  Hand-to-mouth movements.  Increased sucking of fingers or hands.  Fussing.  Intermittent crying.  Signs of extreme hunger will require calming and consoling before you try to feed your baby. Signs of extreme hunger may include:  Restlessness.  A loud, strong cry.  Screaming.  Frequent feeding will help you make more milk and will help prevent problems, such as sore nipples and engorgement of the breasts.  BREASTFEEDING   Whether lying down or sitting, be sure that the baby's abdomen is facing your abdomen.   Support your breast with 4 fingers under your breast and your thumb above your nipple. Make sure your fingers are well away from your nipple and your baby's mouth.   Stroke your baby's lips gently with your finger or nipple.   When your baby's mouth is open wide enough, place all of your nipple and as much of the colored area around your nipple (areola) as possible into your baby's mouth.  More areola should be visible above his or her upper lip than below his or her lower lip.  Your baby's tongue should be between his or her lower gum and your breast.  Ensure that your baby's mouth is correctly positioned around the nipple (latched). Your baby's lips should create a seal on your breast.  Signs that your baby has effectively latched onto your nipple include:  Tugging or sucking  without pain.  Swallowing heard between sucks.  Absent click or smacking sound.  Muscle movement above and in front of his or her ears with sucking.  Your baby must suck about 2 3 minutes in order to get your milk. Allow your baby to feed on each breast as long as he or she wants. Nurse your baby until he or she unlatches or falls asleep at the first breast, then offer the second breast.  Signs that your baby is full and satisfied include:  A gradual decrease in the number of sucks or complete cessation of sucking.  Falling asleep.  Extension or relaxation of his or her body.  Retention of a small amount of milk in his or her mouth.  Letting go of your breast by himself or herself.  Signs of effective breastfeeding in you include:  Breasts that have increased firmness, weight, and size prior to feeding.  Breasts that are softer after nursing.  Increased milk volume, as well as a change in milk consistency and color by the 5th day of breastfeeding.  Breast fullness relieved by breastfeeding.  Nipples are not sore, cracked, or bleeding.  If needed, break the suction by putting your finger into the corner of your baby's mouth and sliding your finger between his or her gums. Then, remove your breast from his or her mouth.  It is common for babies to spit up a small amount after a feeding.  Babies often swallow air during feeding. This can make babies fussy. Burping your baby between breasts can help with this.  Vitamin D supplements are recommended for babies who get only breast milk.  Avoid using a pacifier during your baby's first 4 6 weeks.  Avoid supplemental feedings of water, formula, or   juice in place of breastfeeding. Breast milk is all the food your baby needs. It is not necessary for your baby to have water or formula. Your breasts will make more milk if supplemental feedings are avoided during the early weeks. HOW TO TELL WHETHER YOUR BABY IS GETTING ENOUGH BREAST  MILK Wondering whether or not your baby is getting enough milk is a common concern among mothers. You can be assured that your baby is getting enough milk if:   Your baby is actively sucking and you hear swallowing.   Your baby seems relaxed and satisfied after a feeding.   Your baby nurses at least 8 12 times in a 24 hour time period.  During the first 3 5 days of age:  Your baby is wetting at least 3 5 diapers in a 24 hour period. The urine should be clear and pale yellow.  Your baby is having at least 3 4 stools in a 24 hour period. The stool should be soft and yellow.  At 5 7 days of age, your baby is having at least 3 6 stools in a 24 hour period. The stool should be seedy and yellow by 5 days of age.  Your baby has a weight loss less than 7 10% during the first 3 days of age.  Your baby does not lose weight after 3 7 days of age.  Your baby gains 4 7 ounces each week after he or she is 4 days of age.  Your baby gains weight by 5 days of age and is back to birth weight within 2 weeks. ENGORGEMENT In the first week after your baby is born, you may experience extremely full breasts (engorgement). When engorged, your breasts may feel heavy, warm, or tender to the touch. Engorgement peaks within 24 48 hours after delivery of your baby.  Engorgement may be reduced by:  Continuing to breastfeed.  Increasing the frequency of breastfeeding.  Taking warm showers or applying warm, moist heat to your breasts just before each feeding. This increases circulation and helps the milk flow.   Gently massaging your breast before and during the feedings. With your fingertips, massage from your chest wall towards your nipple in a circular motion.   Ensuring that your baby empties at least one breast at every feeding. It also helps to start the next feeding on the opposite breast.   Expressing breast milk by hand or by using a breast pump to empty the breasts if your baby is sleepy, or  not nursing well. You may also want to express milk if you are returning to work oryou feel you are getting engorged.  Ensuring your baby is latched on and positioned properly while breastfeeding. If you follow these suggestions, your engorgement should improve in 24 48 hours. If you are still experiencing difficulty, call your lactation consultant or caregiver.  CARING FOR YOURSELF Take care of your breasts.  Bathe or shower daily.   Avoid using soap on your nipples.   Wear a supportive bra. Avoid wearing underwire style bras.  Air dry your nipples for a 3 4minutes after each feeding.   Use only cotton bra pads to absorb breast milk leakage. Leaking of breast milk between feedings is normal.   Use only pure lanolin on your nipples after nursing. You do not need to wash it off before feeding your baby again. Another option is to express a few drops of breast milk and gently massage that milk into your nipples.  Continue   breast self-awareness checks. Take care of yourself.  Eat healthy foods. Alternate 3 meals with 3 snacks.  Avoid foods that you notice affect your baby in a bad way.  Drink milk, fruit juice, and water to satisfy your thirst (about 8 glasses a day).   Rest often, relax, and take your prenatal vitamins to prevent fatigue, stress, and anemia.  Avoid chewing and smoking tobacco.  Avoid alcohol and drug use.  Take over-the-counter and prescribed medicine only as directed by your caregiver or pharmacist. You should always check with your caregiver or pharmacist before taking any new medicine, vitamin, or herbal supplement.  Know that pregnancy is possible while breastfeeding. If desired, talk to your caregiver about family planning and safe birth control methods that may be used while breastfeeding. SEEK MEDICAL CARE IF:   You feel like you want to stop breastfeeding or have become frustrated with breastfeeding.  You have painful breasts or nipples.  Your  nipples are cracked or bleeding.  Your breasts are red, tender, or warm.  You have a swollen area on either breast.  You have a fever or chills.  You have nausea or vomiting.  You have drainage from your nipples.  Your breasts do not become full before feedings by the 5th day after delivery.  You feel sad and depressed.  Your baby is too sleepy to eat well.  Your baby is having trouble sleeping.   Your baby is wetting less than 3 diapers in a 24 hour period.  Your baby has less than 3 stools in a 24 hour period.  Your baby's skin or the white part of his or her eyes becomes more yellow.   Your baby is not gaining weight by 5 days of age. MAKE SURE YOU:   Understand these instructions.  Will watch your condition.  Will get help right away if you are not doing well or get worse. Document Released: 10/11/2005 Document Revised: 07/05/2012 Document Reviewed: 05/17/2012 [employer] Patient Information 2014 [employer].  

## 2013-06-28 NOTE — Progress Notes (Signed)
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  Lived in [place]. [place] years, ? Treated there? Quad screening today-schedule anatomy Comes with therapist--she thinks pt. May be using-check UDS

## 2013-07-03 ENCOUNTER — Encounter: Payer: Self-pay | Admitting: Family Medicine

## 2013-07-03 LAB — PRESCRIPTION MONITORING PROFILE (SOLSTAS)
Cannabinoid Scrn, Ur: NEGATIVE ng/mL
Carisoprodol, Urine: NEGATIVE ng/mL
Creatinine, Urine: 252.92 mg/dL (ref >20.0)
MDMA URINE: NEGATIVE ng/mL
Meperidine, Ur: NEGATIVE ng/mL
Methadone Screen, Urine: NEGATIVE ng/mL
Oxycodone Screen, Ur: NEGATIVE ng/mL

## 2013-07-03 LAB — COCAINE METABOLITE (GC/LC/MS), URINE: Benzoylecgonine GC/MS Conf: 4263 ng/mL — AB

## 2013-07-04 ENCOUNTER — Encounter: Payer: Self-pay | Admitting: *Deleted

## 2013-07-05 ENCOUNTER — Other Ambulatory Visit: Payer: Self-pay | Admitting: Family Medicine

## 2013-07-05 ENCOUNTER — Ambulatory Visit (HOSPITAL_COMMUNITY)
Admission: RE | Admit: 2013-07-05 | Discharge: 2013-07-05 | Disposition: A | Discharge status: Home / Self Care | Payer: Medicare Other | Source: Ambulatory Visit | Attending: Family Medicine | Admitting: Family Medicine

## 2013-07-05 ENCOUNTER — Ambulatory Visit (HOSPITAL_COMMUNITY): Payer: Medicare Other

## 2013-07-05 ENCOUNTER — Ambulatory Visit (INDEPENDENT_AMBULATORY_CARE_PROVIDER_SITE_OTHER): Payer: Medicare Other

## 2013-07-05 DIAGNOSIS — F1910 Other psychoactive substance abuse, uncomplicated: Secondary | ICD-10-CM | POA: Insufficient documentation

## 2013-07-05 DIAGNOSIS — O358XX0 Maternal care for other (suspected) fetal abnormality and damage, not applicable or unspecified: Secondary | ICD-10-CM | POA: Insufficient documentation

## 2013-07-05 DIAGNOSIS — O09522 Supervision of elderly multigravida, second trimester: Secondary | ICD-10-CM

## 2013-07-05 DIAGNOSIS — O98119 Syphilis complicating pregnancy, unspecified trimester: Secondary | ICD-10-CM

## 2013-07-05 DIAGNOSIS — Z363 Encounter for antenatal screening for malformations: Secondary | ICD-10-CM | POA: Insufficient documentation

## 2013-07-05 DIAGNOSIS — O99340 Other mental disorders complicating pregnancy, unspecified trimester: Secondary | ICD-10-CM | POA: Insufficient documentation

## 2013-07-05 DIAGNOSIS — Z1389 Encounter for screening for other disorder: Secondary | ICD-10-CM | POA: Insufficient documentation

## 2013-07-05 DIAGNOSIS — O09529 Supervision of elderly multigravida, unspecified trimester: Secondary | ICD-10-CM | POA: Insufficient documentation

## 2013-07-05 DIAGNOSIS — A539 Syphilis, unspecified: Secondary | ICD-10-CM

## 2013-07-05 LAB — PMP METHAQUALONE W/CONF: Methaqualone: NEGATIVE ng/mL

## 2013-07-05 LAB — PMP PCP W/CONF: Phencyclidine, Ur: NEGATIVE ng/mL

## 2013-07-06 ENCOUNTER — Encounter: Payer: Self-pay | Admitting: Family Medicine

## 2013-07-12 ENCOUNTER — Ambulatory Visit (INDEPENDENT_AMBULATORY_CARE_PROVIDER_SITE_OTHER): Payer: Medicare Other

## 2013-07-12 DIAGNOSIS — O98119 Syphilis complicating pregnancy, unspecified trimester: Secondary | ICD-10-CM

## 2013-07-12 DIAGNOSIS — O98112 Syphilis complicating pregnancy, second trimester: Secondary | ICD-10-CM

## 2013-07-19 ENCOUNTER — Encounter: Payer: Self-pay | Admitting: *Deleted

## 2013-07-19 ENCOUNTER — Encounter: Payer: Medicare Other | Admitting: Obstetrics and Gynecology

## 2013-08-02 ENCOUNTER — Ambulatory Visit (INDEPENDENT_AMBULATORY_CARE_PROVIDER_SITE_OTHER): Payer: Medicare Other | Admitting: Family Medicine

## 2013-08-02 ENCOUNTER — Encounter: Payer: Self-pay | Admitting: *Deleted

## 2013-08-02 VITALS — BP 105/64 | Temp 96.6°F | Wt 149.0 lb

## 2013-08-02 DIAGNOSIS — O09523 Supervision of elderly multigravida, third trimester: Secondary | ICD-10-CM

## 2013-08-02 DIAGNOSIS — Z23 Encounter for immunization: Secondary | ICD-10-CM

## 2013-08-02 DIAGNOSIS — O0990 Supervision of high risk pregnancy, unspecified, unspecified trimester: Secondary | ICD-10-CM

## 2013-08-02 DIAGNOSIS — O09529 Supervision of elderly multigravida, unspecified trimester: Secondary | ICD-10-CM

## 2013-08-02 DIAGNOSIS — O09522 Supervision of elderly multigravida, second trimester: Secondary | ICD-10-CM

## 2013-08-02 LAB — POCT URINALYSIS DIP (DEVICE)
Glucose, UA: NEGATIVE mg/dL
Hgb urine dipstick: NEGATIVE
Ketones, ur: NEGATIVE mg/dL
Specific Gravity, Urine: >=1.03 (ref 1.005–1.030)
Urobilinogen, UA: 1 mg/dL (ref 0.0–1.0)

## 2013-08-02 MED ORDER — PRENATAL VITAMINS 0.8 MG PO TABS: 1.0000 | ORAL_TABLET | Freq: Every day | ORAL | Status: DC

## 2013-08-02 NOTE — Progress Notes (Signed)
Quad screen WNL Completed Syphilis treatment.-has not had partner treated. Discussed + drug test-will re-check each visit.

## 2013-08-02 NOTE — Addendum Note (Signed)
Addended by: Franchot Mimes on: 08/02/2013 04:43 PM   Modules accepted: Orders

## 2013-08-02 NOTE — Patient Instructions (Signed)
Pregnancy - Second Trimester The second trimester of pregnancy (3 to 6 months) is a period of rapid growth for you and your baby. At the end of the sixth month, your baby is about 9 inches long and weighs 1 1/2 pounds. You will begin to feel the baby move between 18 and 20 weeks of the pregnancy. This is called quickening. Weight gain is faster. A clear fluid (colostrum) may leak out of your breasts. You may feel small contractions of the womb (uterus). This is known as false labor or Braxton-Hicks contractions. This is like a practice for labor when the baby is ready to be born. Usually, the problems with morning sickness have usually passed by the end of your first trimester. Some women develop small dark blotches (called cholasma, mask of pregnancy) on their face that usually goes away after the baby is born. Exposure to the sun makes the blotches worse. Acne may also develop in some pregnant women and pregnant women who have acne, may find that it goes away. PRENATAL EXAMS  Blood work may continue to be done during prenatal exams. These tests are done to check on your health and the probable health of your baby. Blood work is used to follow your blood levels (hemoglobin). Anemia (low hemoglobin) is common during pregnancy. Iron and vitamins are given to help prevent this. You will also be checked for diabetes between 24 and 28 weeks of the pregnancy. Some of the previous blood tests may be repeated.  The size of the uterus is measured during each visit. This is to make sure that the baby is continuing to grow properly according to the dates of the pregnancy.  Your blood pressure is checked every prenatal visit. This is to make sure you are not getting toxemia.  Your urine is checked to make sure you do not have an infection, diabetes or protein in the urine.  Your weight is checked often to make sure gains are happening at the suggested rate. This is to ensure that both you and your baby are  growing normally.  Sometimes, an ultrasound is performed to confirm the proper growth and development of the baby. This is a test which bounces harmless sound waves off the baby so your caregiver can more accurately determine due dates. Sometimes, a test is done on the amniotic fluid surrounding the baby. This test is called an amniocentesis. The amniotic fluid is obtained by sticking a needle into the belly (abdomen). This is done to check the chromosomes in instances where there is a concern about possible genetic problems with the baby. It is also sometimes done near the end of pregnancy if an early delivery is required. In this case, it is done to help make sure the baby's lungs are mature enough for the baby to live outside of the womb. CHANGES OCCURING IN THE SECOND TRIMESTER OF PREGNANCY Your body goes through many changes during pregnancy. They vary from person to person. Talk to your caregiver about changes you notice that you are concerned about.  During the second trimester, you will likely have an increase in your appetite. It is normal to have cravings for certain foods. This varies from person to person and pregnancy to pregnancy.  Your lower abdomen will begin to bulge.  You may have to urinate more often because the uterus and baby are pressing on your bladder. It is also common to get more bladder infections during pregnancy. You can help this by drinking lots of fluids   and emptying your bladder before and after intercourse.  You may begin to get stretch marks on your hips, abdomen, and breasts. These are normal changes in the body during pregnancy. There are no exercises or medicines to take that prevent this change.  You may begin to develop swollen and bulging veins (varicose veins) in your legs. Wearing support hose, elevating your feet for 15 minutes, 3 to 4 times a day and limiting salt in your diet helps lessen the problem.  Heartburn may develop as the uterus grows and  pushes up against the stomach. Antacids recommended by your caregiver helps with this problem. Also, eating smaller meals 4 to 5 times a day helps.  Constipation can be treated with a stool softener or adding bulk to your diet. Drinking lots of fluids, and eating vegetables, fruits, and whole grains are helpful.  Exercising is also helpful. If you have been very active up until your pregnancy, most of these activities can be continued during your pregnancy. If you have been less active, it is helpful to start an exercise program such as walking.  Hemorrhoids may develop at the end of the second trimester. Warm sitz baths and hemorrhoid cream recommended by your caregiver helps hemorrhoid problems.  Backaches may develop during this time of your pregnancy. Avoid heavy lifting, wear low heal shoes, and practice good posture to help with backache problems.  Some pregnant women develop tingling and numbness of their hand and fingers because of swelling and tightening of ligaments in the wrist (carpel tunnel syndrome). This goes away after the baby is born.  As your breasts enlarge, you may have to get a bigger bra. Get a comfortable, cotton, support bra. Do not get a nursing bra until the last month of the pregnancy if you will be nursing the baby.  You may get a dark line from your belly button to the pubic area called the linea nigra.  You may develop rosy cheeks because of increase blood flow to the face.  You may develop spider looking lines of the face, neck, arms, and chest. These go away after the baby is born. HOME CARE INSTRUCTIONS   It is extremely important to avoid all smoking, herbs, alcohol, and unprescribed drugs during your pregnancy. These chemicals affect the formation and growth of the baby. Avoid these chemicals throughout the pregnancy to ensure the delivery of a healthy infant.  Most of your home care instructions are the same as suggested for the first trimester of your  pregnancy. Keep your caregiver's appointments. Follow your caregiver's instructions regarding medicine use, exercise, and diet.  During pregnancy, you are providing food for you and your baby. Continue to eat regular, well-balanced meals. Choose foods such as meat, fish, milk and other low fat dairy products, vegetables, fruits, and whole-grain breads and cereals. Your caregiver will tell you of the ideal weight gain.  A physical sexual relationship may be continued up until near the end of pregnancy if there are no other problems. Problems could include early (premature) leaking of amniotic fluid from the membranes, vaginal bleeding, abdominal pain, or other medical or pregnancy problems.  Exercise regularly if there are no restrictions. Check with your caregiver if you are unsure of the safety of some of your exercises. The greatest weight gain will occur in the last 2 trimesters of pregnancy. Exercise will help you:  Control your weight.  Get you in shape for labor and delivery.  Lose weight after you have the baby.  Wear   a good support or jogging bra for breast tenderness during pregnancy. This may help if worn during sleep. Pads or tissues may be used in the bra if you are leaking colostrum.  Do not use hot tubs, steam rooms or saunas throughout the pregnancy.  Wear your seat belt at all times when driving. This protects you and your baby if you are in an accident.  Avoid raw meat, uncooked cheese, cat litter boxes, and soil used by cats. These carry germs that can cause birth defects in the baby.  The second trimester is also a good time to visit your dentist for your dental health if this has not been done yet. Getting your teeth cleaned is okay. Use a soft toothbrush. Brush gently during pregnancy.  It is easier to leak urine during pregnancy. Tightening up and strengthening the pelvic muscles will help with this problem. Practice stopping your urination while you are going to the  bathroom. These are the same muscles you need to strengthen. It is also the muscles you would use as if you were trying to stop from passing gas. You can practice tightening these muscles up 10 times a set and repeating this about 3 times per day. Once you know what muscles to tighten up, do not perform these exercises during urination. It is more likely to contribute to an infection by backing up the urine.  Ask for help if you have financial, counseling, or nutritional needs during pregnancy. Your caregiver will be able to offer counseling for these needs as well as refer you for other special needs.  Your skin may become oily. If so, wash your face with mild soap, use non-greasy moisturizer and oil or cream based makeup. MEDICINES AND DRUG USE IN PREGNANCY  Take prenatal vitamins as directed. The vitamin should contain 1 milligram of folic acid. Keep all vitamins out of reach of children. Only a couple vitamins or tablets containing iron may be fatal to a baby or young child when ingested.  Avoid use of all medicines, including herbs, over-the-counter medicines, not prescribed or suggested by your caregiver. Only take over-the-counter or prescription medicines for pain, discomfort, or fever as directed by your caregiver. Do not use aspirin.  Let your caregiver also know about herbs you may be using.  Alcohol is related to a number of birth defects. This includes fetal alcohol syndrome. All alcohol, in any form, should be avoided completely. Smoking will cause low birth rate and premature babies.  Street or illegal drugs are very harmful to the baby. They are absolutely forbidden. A baby born to an addicted mother will be addicted at birth. The baby will go through the same withdrawal an adult does. SEEK MEDICAL CARE IF:  You have any concerns or worries during your pregnancy. It is better to call with your questions if you feel they cannot wait, rather than worry about them. SEEK IMMEDIATE  MEDICAL CARE IF:   An unexplained oral temperature above 102 F (38.9 C) develops, or as your caregiver suggests.  You have leaking of fluid from the vagina (birth canal). If leaking membranes are suspected, take your temperature and tell your caregiver of this when you call.  There is vaginal spotting, bleeding, or passing clots. Tell your caregiver of the amount and how many pads are used. Light spotting in pregnancy is common, especially following intercourse.  You develop a bad smelling vaginal discharge with a change in the color from clear to white.  You continue to feel   sick to your stomach (nauseated) and have no relief from remedies suggested. You vomit blood or coffee ground-like materials.  You lose more than 2 pounds of weight or gain more than 2 pounds of weight over 1 week, or as suggested by your caregiver.  You notice swelling of your face, hands, feet, or legs.  You get exposed to German measles and have never had them.  You are exposed to fifth disease or chickenpox.  You develop belly (abdominal) pain. Round ligament discomfort is a common non-cancerous (benign) cause of abdominal pain in pregnancy. Your caregiver still must evaluate you.  You develop a bad headache that does not go away.  You develop fever, diarrhea, pain with urination, or shortness of breath.  You develop visual problems, blurry, or double vision.  You fall or are in a car accident or any kind of trauma.  There is mental or physical violence at home. Document Released: 10/05/2001 Document Revised: 07/05/2012 Document Reviewed: 04/09/2009 [employer] Patient Information 2014 [employer].  Breastfeeding A change in hormones during your pregnancy causes growth of your breast tissue and an increase in number and size of milk ducts. The hormone prolactin allows proteins, sugars, and fats from your blood supply to make breast milk in your milk-producing glands. The hormone progesterone prevents  breast milk from being released before the birth of your baby. After the birth of your baby, your progesterone level decreases allowing breast milk to be released. Thoughts of your baby, as well as his or her sucking or crying, can stimulate the release of milk from the milk-producing glands. Deciding to breastfeed (nurse) is one of the best choices you can make for you and your baby. The information that follows gives a brief review of the benefits, as well as other important skills to know about breastfeeding. BENEFITS OF BREASTFEEDING For your baby  The first milk (colostrum) helps your baby's digestive system function better.   There are antibodies in your milk that help your baby fight off infections.   Your baby has a lower incidence of asthma, allergies, and sudden infant death syndrome (SIDS).   The nutrients in breast milk are better for your baby than infant formulas.  Breast milk improves your baby's brain development.   Your baby will have less gas, colic, and constipation.  Your baby is less likely to develop other conditions, such as childhood obesity, asthma, or diabetes mellitus. For you  Breastfeeding helps develop a very special bond between you and your baby.   Breastfeeding is convenient, always available at the correct temperature, and costs nothing.   Breastfeeding helps to burn calories and helps you lose the weight gained during pregnancy.   Breastfeeding makes your uterus contract back down to normal size faster and slows bleeding following delivery.   Breastfeeding mothers have a lower risk of developing osteoporosis or breast or ovarian cancer later in life.  BREASTFEEDING FREQUENCY  A healthy, full-term baby may breastfeed as often as every hour or space his or her feedings to every 3 hours. Breastfeeding frequency will vary from baby to baby.   Newborns should be fed no less than every 2 3 hours during the day and every 4 5 hours during the  night. You should breastfeed a minimum of 8 feedings in a 24 hour period.  Awaken your baby to breastfeed if it has been 3 4 hours since the last feeding.  Breastfeed when you feel the need to reduce the fullness of your breasts or when   your newborn shows signs of hunger. Signs that your baby may be hungry include:  Increased alertness or activity.  Stretching.  Movement of the head from side to side.  Movement of the head and opening of the mouth when the corner of the mouth or cheek is stroked (rooting).  Increased sucking sounds, smacking lips, cooing, sighing, or squeaking.  Hand-to-mouth movements.  Increased sucking of fingers or hands.  Fussing.  Intermittent crying.  Signs of extreme hunger will require calming and consoling before you try to feed your baby. Signs of extreme hunger may include:  Restlessness.  A loud, strong cry.  Screaming.  Frequent feeding will help you make more milk and will help prevent problems, such as sore nipples and engorgement of the breasts.  BREASTFEEDING   Whether lying down or sitting, be sure that the baby's abdomen is facing your abdomen.   Support your breast with 4 fingers under your breast and your thumb above your nipple. Make sure your fingers are well away from your nipple and your baby's mouth.   Stroke your baby's lips gently with your finger or nipple.   When your baby's mouth is open wide enough, place all of your nipple and as much of the colored area around your nipple (areola) as possible into your baby's mouth.  More areola should be visible above his or her upper lip than below his or her lower lip.  Your baby's tongue should be between his or her lower gum and your breast.  Ensure that your baby's mouth is correctly positioned around the nipple (latched). Your baby's lips should create a seal on your breast.  Signs that your baby has effectively latched onto your nipple include:  Tugging or sucking  without pain.  Swallowing heard between sucks.  Absent click or smacking sound.  Muscle movement above and in front of his or her ears with sucking.  Your baby must suck about 2 3 minutes in order to get your milk. Allow your baby to feed on each breast as long as he or she wants. Nurse your baby until he or she unlatches or falls asleep at the first breast, then offer the second breast.  Signs that your baby is full and satisfied include:  A gradual decrease in the number of sucks or complete cessation of sucking.  Falling asleep.  Extension or relaxation of his or her body.  Retention of a small amount of milk in his or her mouth.  Letting go of your breast by himself or herself.  Signs of effective breastfeeding in you include:  Breasts that have increased firmness, weight, and size prior to feeding.  Breasts that are softer after nursing.  Increased milk volume, as well as a change in milk consistency and color by the 5th day of breastfeeding.  Breast fullness relieved by breastfeeding.  Nipples are not sore, cracked, or bleeding.  If needed, break the suction by putting your finger into the corner of your baby's mouth and sliding your finger between his or her gums. Then, remove your breast from his or her mouth.  It is common for babies to spit up a small amount after a feeding.  Babies often swallow air during feeding. This can make babies fussy. Burping your baby between breasts can help with this.  Vitamin D supplements are recommended for babies who get only breast milk.  Avoid using a pacifier during your baby's first 4 6 weeks.  Avoid supplemental feedings of water, formula, or   juice in place of breastfeeding. Breast milk is all the food your baby needs. It is not necessary for your baby to have water or formula. Your breasts will make more milk if supplemental feedings are avoided during the early weeks. HOW TO TELL WHETHER YOUR BABY IS GETTING ENOUGH BREAST  MILK Wondering whether or not your baby is getting enough milk is a common concern among mothers. You can be assured that your baby is getting enough milk if:   Your baby is actively sucking and you hear swallowing.   Your baby seems relaxed and satisfied after a feeding.   Your baby nurses at least 8 12 times in a 24 hour time period.  During the first 3 5 days of age:  Your baby is wetting at least 3 5 diapers in a 24 hour period. The urine should be clear and pale yellow.  Your baby is having at least 3 4 stools in a 24 hour period. The stool should be soft and yellow.  At 5 7 days of age, your baby is having at least 3 6 stools in a 24 hour period. The stool should be seedy and yellow by 5 days of age.  Your baby has a weight loss less than 7 10% during the first 3 days of age.  Your baby does not lose weight after 3 7 days of age.  Your baby gains 4 7 ounces each week after he or she is 4 days of age.  Your baby gains weight by 5 days of age and is back to birth weight within 2 weeks. ENGORGEMENT In the first week after your baby is born, you may experience extremely full breasts (engorgement). When engorged, your breasts may feel heavy, warm, or tender to the touch. Engorgement peaks within 24 48 hours after delivery of your baby.  Engorgement may be reduced by:  Continuing to breastfeed.  Increasing the frequency of breastfeeding.  Taking warm showers or applying warm, moist heat to your breasts just before each feeding. This increases circulation and helps the milk flow.   Gently massaging your breast before and during the feedings. With your fingertips, massage from your chest wall towards your nipple in a circular motion.   Ensuring that your baby empties at least one breast at every feeding. It also helps to start the next feeding on the opposite breast.   Expressing breast milk by hand or by using a breast pump to empty the breasts if your baby is sleepy, or  not nursing well. You may also want to express milk if you are returning to work oryou feel you are getting engorged.  Ensuring your baby is latched on and positioned properly while breastfeeding. If you follow these suggestions, your engorgement should improve in 24 48 hours. If you are still experiencing difficulty, call your lactation consultant or caregiver.  CARING FOR YOURSELF Take care of your breasts.  Bathe or shower daily.   Avoid using soap on your nipples.   Wear a supportive bra. Avoid wearing underwire style bras.  Air dry your nipples for a 3 4minutes after each feeding.   Use only cotton bra pads to absorb breast milk leakage. Leaking of breast milk between feedings is normal.   Use only pure lanolin on your nipples after nursing. You do not need to wash it off before feeding your baby again. Another option is to express a few drops of breast milk and gently massage that milk into your nipples.  Continue   breast self-awareness checks. Take care of yourself.  Eat healthy foods. Alternate 3 meals with 3 snacks.  Avoid foods that you notice affect your baby in a bad way.  Drink milk, fruit juice, and water to satisfy your thirst (about 8 glasses a day).   Rest often, relax, and take your prenatal vitamins to prevent fatigue, stress, and anemia.  Avoid chewing and smoking tobacco.  Avoid alcohol and drug use.  Take over-the-counter and prescribed medicine only as directed by your caregiver or pharmacist. You should always check with your caregiver or pharmacist before taking any new medicine, vitamin, or herbal supplement.  Know that pregnancy is possible while breastfeeding. If desired, talk to your caregiver about family planning and safe birth control methods that may be used while breastfeeding. SEEK MEDICAL CARE IF:   You feel like you want to stop breastfeeding or have become frustrated with breastfeeding.  You have painful breasts or nipples.  Your  nipples are cracked or bleeding.  Your breasts are red, tender, or warm.  You have a swollen area on either breast.  You have a fever or chills.  You have nausea or vomiting.  You have drainage from your nipples.  Your breasts do not become full before feedings by the 5th day after delivery.  You feel sad and depressed.  Your baby is too sleepy to eat well.  Your baby is having trouble sleeping.   Your baby is wetting less than 3 diapers in a 24 hour period.  Your baby has less than 3 stools in a 24 hour period.  Your baby's skin or the white part of his or her eyes becomes more yellow.   Your baby is not gaining weight by 5 days of age. MAKE SURE YOU:   Understand these instructions.  Will watch your condition.  Will get help right away if you are not doing well or get worse. Document Released: 10/11/2005 Document Revised: 07/05/2012 Document Reviewed: 05/17/2012 [employer] Patient Information 2014 [employer].  

## 2013-08-02 NOTE — Progress Notes (Signed)
Pulse- 107 Thyroid levels need to be checked pt has not been taking thyroid medication

## 2013-08-03 ENCOUNTER — Encounter: Payer: Self-pay | Admitting: *Deleted

## 2013-08-03 LAB — ALCOHOL METABOLITE (ETG), URINE: Ethyl Glucuronide (EtG): NEGATIVE ng/mL

## 2013-08-07 LAB — PRESCRIPTION MONITORING PROFILE (19 PANEL)
Amphetamine/Meth: NEGATIVE ng/mL
Barbiturate Screen, Urine: NEGATIVE ng/mL
Carisoprodol, Urine: NEGATIVE ng/mL
Creatinine, Urine: 423.34 mg/dL (ref >20.0)
Methadone Screen, Urine: NEGATIVE ng/mL
Nitrites, Initial: NEGATIVE ug/mL
Opiate Screen, Urine: NEGATIVE ng/mL
Oxycodone Screen, Ur: NEGATIVE ng/mL
Phencyclidine, Ur: NEGATIVE ng/mL
Propoxyphene: NEGATIVE ng/mL
Tapentadol, urine: NEGATIVE ng/mL
Tramadol Scrn, Ur: NEGATIVE ng/mL
pH, Initial: 5.6 pH (ref 4.5–8.9)

## 2013-08-07 LAB — CANNABANOIDS (GC/LC/MS), URINE: THC-COOH (GC/LC/MS), ur confirm: 32 ng/mL — AB

## 2013-08-13 ENCOUNTER — Telehealth: Payer: Self-pay | Admitting: Obstetrics and Gynecology

## 2013-08-13 NOTE — Telephone Encounter (Signed)
Cassidy Thomas from [facility] [phone])  faxed a Release Of Information signed by patient to get information about [facility], and progress summary of care for their evaluation on patient's mental health . Gave information needed. ROI scanned.

## 2013-08-23 ENCOUNTER — Other Ambulatory Visit: Payer: Self-pay | Admitting: Family Medicine

## 2013-08-23 ENCOUNTER — Ambulatory Visit (HOSPITAL_COMMUNITY)
Admission: RE | Admit: 2013-08-23 | Discharge: 2013-08-23 | Disposition: A | Discharge status: Home / Self Care | Payer: Medicare Other | Source: Ambulatory Visit | Attending: Family Medicine | Admitting: Family Medicine

## 2013-08-23 ENCOUNTER — Ambulatory Visit (INDEPENDENT_AMBULATORY_CARE_PROVIDER_SITE_OTHER): Payer: Medicare Other | Admitting: Family Medicine

## 2013-08-23 VITALS — BP 110/68 | Wt 155.8 lb

## 2013-08-23 DIAGNOSIS — F1910 Other psychoactive substance abuse, uncomplicated: Secondary | ICD-10-CM | POA: Insufficient documentation

## 2013-08-23 DIAGNOSIS — O09522 Supervision of elderly multigravida, second trimester: Secondary | ICD-10-CM

## 2013-08-23 DIAGNOSIS — O09529 Supervision of elderly multigravida, unspecified trimester: Secondary | ICD-10-CM | POA: Insufficient documentation

## 2013-08-23 DIAGNOSIS — O99340 Other mental disorders complicating pregnancy, unspecified trimester: Secondary | ICD-10-CM | POA: Insufficient documentation

## 2013-08-23 DIAGNOSIS — O0990 Supervision of high risk pregnancy, unspecified, unspecified trimester: Secondary | ICD-10-CM

## 2013-08-23 DIAGNOSIS — F1920 Other psychoactive substance dependence, uncomplicated: Secondary | ICD-10-CM

## 2013-08-23 DIAGNOSIS — O99322 Drug use complicating pregnancy, second trimester: Secondary | ICD-10-CM

## 2013-08-23 DIAGNOSIS — Z3689 Encounter for other specified antenatal screening: Secondary | ICD-10-CM | POA: Insufficient documentation

## 2013-08-23 DIAGNOSIS — O09523 Supervision of elderly multigravida, third trimester: Secondary | ICD-10-CM

## 2013-08-23 LAB — POCT URINALYSIS DIP (DEVICE)
Bilirubin Urine: NEGATIVE
Ketones, ur: 15 mg/dL — AB
Nitrite: NEGATIVE
pH: 6 (ref 5.0–8.0)

## 2013-08-23 NOTE — Progress Notes (Signed)
Pt. Is unclear about fetal movement-states she knows it is there.  Last drug screen is negative. Recheck syphilis titer with 28 wk labs next visit. F/u U/S with MFM today

## 2013-08-23 NOTE — Patient Instructions (Signed)
Pregnancy - Second Trimester The second trimester of pregnancy (3 to 6 months) is a period of rapid growth for you and your baby. At the end of the sixth month, your baby is about 9 inches long and weighs 1 1/2 pounds. You will begin to feel the baby move between 18 and 20 weeks of the pregnancy. This is called quickening. Weight gain is faster. A clear fluid (colostrum) may leak out of your breasts. You may feel small contractions of the womb (uterus). This is known as false labor or Braxton-Hicks contractions. This is like a practice for labor when the baby is ready to be born. Usually, the problems with morning sickness have usually passed by the end of your first trimester. Some women develop small dark blotches (called cholasma, mask of pregnancy) on their face that usually goes away after the baby is born. Exposure to the sun makes the blotches worse. Acne may also develop in some pregnant women and pregnant women who have acne, may find that it goes away. PRENATAL EXAMS  Blood work may continue to be done during prenatal exams. These tests are done to check on your health and the probable health of your baby. Blood work is used to follow your blood levels (hemoglobin). Anemia (low hemoglobin) is common during pregnancy. Iron and vitamins are given to help prevent this. You will also be checked for diabetes between 24 and 28 weeks of the pregnancy. Some of the previous blood tests may be repeated.  The size of the uterus is measured during each visit. This is to make sure that the baby is continuing to grow properly according to the dates of the pregnancy.  Your blood pressure is checked every prenatal visit. This is to make sure you are not getting toxemia.  Your urine is checked to make sure you do not have an infection, diabetes or protein in the urine.  Your weight is checked often to make sure gains are happening at the suggested rate. This is to ensure that both you and your baby are  growing normally.  Sometimes, an ultrasound is performed to confirm the proper growth and development of the baby. This is a test which bounces harmless sound waves off the baby so your caregiver can more accurately determine due dates. Sometimes, a test is done on the amniotic fluid surrounding the baby. This test is called an amniocentesis. The amniotic fluid is obtained by sticking a needle into the belly (abdomen). This is done to check the chromosomes in instances where there is a concern about possible genetic problems with the baby. It is also sometimes done near the end of pregnancy if an early delivery is required. In this case, it is done to help make sure the baby's lungs are mature enough for the baby to live outside of the womb. CHANGES OCCURING IN THE SECOND TRIMESTER OF PREGNANCY Your body goes through many changes during pregnancy. They vary from person to person. Talk to your caregiver about changes you notice that you are concerned about.  During the second trimester, you will likely have an increase in your appetite. It is normal to have cravings for certain foods. This varies from person to person and pregnancy to pregnancy.  Your lower abdomen will begin to bulge.  You may have to urinate more often because the uterus and baby are pressing on your bladder. It is also common to get more bladder infections during pregnancy. You can help this by drinking lots of fluids   and emptying your bladder before and after intercourse.  You may begin to get stretch marks on your hips, abdomen, and breasts. These are normal changes in the body during pregnancy. There are no exercises or medicines to take that prevent this change.  You may begin to develop swollen and bulging veins (varicose veins) in your legs. Wearing support hose, elevating your feet for 15 minutes, 3 to 4 times a day and limiting salt in your diet helps lessen the problem.  Heartburn may develop as the uterus grows and  pushes up against the stomach. Antacids recommended by your caregiver helps with this problem. Also, eating smaller meals 4 to 5 times a day helps.  Constipation can be treated with a stool softener or adding bulk to your diet. Drinking lots of fluids, and eating vegetables, fruits, and whole grains are helpful.  Exercising is also helpful. If you have been very active up until your pregnancy, most of these activities can be continued during your pregnancy. If you have been less active, it is helpful to start an exercise program such as walking.  Hemorrhoids may develop at the end of the second trimester. Warm sitz baths and hemorrhoid cream recommended by your caregiver helps hemorrhoid problems.  Backaches may develop during this time of your pregnancy. Avoid heavy lifting, wear low heal shoes, and practice good posture to help with backache problems.  Some pregnant women develop tingling and numbness of their hand and fingers because of swelling and tightening of ligaments in the wrist (carpel tunnel syndrome). This goes away after the baby is born.  As your breasts enlarge, you may have to get a bigger bra. Get a comfortable, cotton, support bra. Do not get a nursing bra until the last month of the pregnancy if you will be nursing the baby.  You may get a dark line from your belly button to the pubic area called the linea nigra.  You may develop rosy cheeks because of increase blood flow to the face.  You may develop spider looking lines of the face, neck, arms, and chest. These go away after the baby is born. HOME CARE INSTRUCTIONS   It is extremely important to avoid all smoking, herbs, alcohol, and unprescribed drugs during your pregnancy. These chemicals affect the formation and growth of the baby. Avoid these chemicals throughout the pregnancy to ensure the delivery of a healthy infant.  Most of your home care instructions are the same as suggested for the first trimester of your  pregnancy. Keep your caregiver's appointments. Follow your caregiver's instructions regarding medicine use, exercise, and diet.  During pregnancy, you are providing food for you and your baby. Continue to eat regular, well-balanced meals. Choose foods such as meat, fish, milk and other low fat dairy products, vegetables, fruits, and whole-grain breads and cereals. Your caregiver will tell you of the ideal weight gain.  A physical sexual relationship may be continued up until near the end of pregnancy if there are no other problems. Problems could include early (premature) leaking of amniotic fluid from the membranes, vaginal bleeding, abdominal pain, or other medical or pregnancy problems.  Exercise regularly if there are no restrictions. Check with your caregiver if you are unsure of the safety of some of your exercises. The greatest weight gain will occur in the last 2 trimesters of pregnancy. Exercise will help you:  Control your weight.  Get you in shape for labor and delivery.  Lose weight after you have the baby.  Wear   a good support or jogging bra for breast tenderness during pregnancy. This may help if worn during sleep. Pads or tissues may be used in the bra if you are leaking colostrum.  Do not use hot tubs, steam rooms or saunas throughout the pregnancy.  Wear your seat belt at all times when driving. This protects you and your baby if you are in an accident.  Avoid raw meat, uncooked cheese, cat litter boxes, and soil used by cats. These carry germs that can cause birth defects in the baby.  The second trimester is also a good time to visit your dentist for your dental health if this has not been done yet. Getting your teeth cleaned is okay. Use a soft toothbrush. Brush gently during pregnancy.  It is easier to leak urine during pregnancy. Tightening up and strengthening the pelvic muscles will help with this problem. Practice stopping your urination while you are going to the  bathroom. These are the same muscles you need to strengthen. It is also the muscles you would use as if you were trying to stop from passing gas. You can practice tightening these muscles up 10 times a set and repeating this about 3 times per day. Once you know what muscles to tighten up, do not perform these exercises during urination. It is more likely to contribute to an infection by backing up the urine.  Ask for help if you have financial, counseling, or nutritional needs during pregnancy. Your caregiver will be able to offer counseling for these needs as well as refer you for other special needs.  Your skin may become oily. If so, wash your face with mild soap, use non-greasy moisturizer and oil or cream based makeup. MEDICINES AND DRUG USE IN PREGNANCY  Take prenatal vitamins as directed. The vitamin should contain 1 milligram of folic acid. Keep all vitamins out of reach of children. Only a couple vitamins or tablets containing iron may be fatal to a baby or young child when ingested.  Avoid use of all medicines, including herbs, over-the-counter medicines, not prescribed or suggested by your caregiver. Only take over-the-counter or prescription medicines for pain, discomfort, or fever as directed by your caregiver. Do not use aspirin.  Let your caregiver also know about herbs you may be using.  Alcohol is related to a number of birth defects. This includes fetal alcohol syndrome. All alcohol, in any form, should be avoided completely. Smoking will cause low birth rate and premature babies.  Street or illegal drugs are very harmful to the baby. They are absolutely forbidden. A baby born to an addicted mother will be addicted at birth. The baby will go through the same withdrawal an adult does. SEEK MEDICAL CARE IF:  You have any concerns or worries during your pregnancy. It is better to call with your questions if you feel they cannot wait, rather than worry about them. SEEK IMMEDIATE  MEDICAL CARE IF:   An unexplained oral temperature above 102 F (38.9 C) develops, or as your caregiver suggests.  You have leaking of fluid from the vagina (birth canal). If leaking membranes are suspected, take your temperature and tell your caregiver of this when you call.  There is vaginal spotting, bleeding, or passing clots. Tell your caregiver of the amount and how many pads are used. Light spotting in pregnancy is common, especially following intercourse.  You develop a bad smelling vaginal discharge with a change in the color from clear to white.  You continue to feel   sick to your stomach (nauseated) and have no relief from remedies suggested. You vomit blood or coffee ground-like materials.  You lose more than 2 pounds of weight or gain more than 2 pounds of weight over 1 week, or as suggested by your caregiver.  You notice swelling of your face, hands, feet, or legs.  You get exposed to German measles and have never had them.  You are exposed to fifth disease or chickenpox.  You develop belly (abdominal) pain. Round ligament discomfort is a common non-cancerous (benign) cause of abdominal pain in pregnancy. Your caregiver still must evaluate you.  You develop a bad headache that does not go away.  You develop fever, diarrhea, pain with urination, or shortness of breath.  You develop visual problems, blurry, or double vision.  You fall or are in a car accident or any kind of trauma.  There is mental or physical violence at home. Document Released: 10/05/2001 Document Revised: 07/05/2012 Document Reviewed: 04/09/2009 [employer] Patient Information 2014 [employer].  

## 2013-08-23 NOTE — Progress Notes (Signed)
Pulse: 73 Pt reports that baby isn't moving.

## 2013-08-24 LAB — PRESCRIPTION MONITORING PROFILE (19 PANEL)
Amphetamine/Meth: NEGATIVE ng/mL
Buprenorphine, Urine: NEGATIVE ng/mL
Cannabinoid Scrn, Ur: NEGATIVE ng/mL
Creatinine, Urine: 117.14 mg/dL (ref >20.0)
Fentanyl, Ur: NEGATIVE ng/mL
MDMA URINE: NEGATIVE ng/mL
Methadone Screen, Urine: NEGATIVE ng/mL
Nitrites, Initial: NEGATIVE ug/mL
Opiate Screen, Urine: NEGATIVE ng/mL
Oxycodone Screen, Ur: NEGATIVE ng/mL
Propoxyphene: NEGATIVE ng/mL
Tapentadol, urine: NEGATIVE ng/mL
Zolpidem, Urine: NEGATIVE ng/mL

## 2013-08-24 LAB — ALCOHOL METABOLITE (ETG), URINE: Ethyl Glucuronide (EtG): NEGATIVE ng/mL

## 2013-08-29 ENCOUNTER — Telehealth: Payer: Self-pay | Admitting: *Deleted

## 2013-08-29 NOTE — Telephone Encounter (Addendum)
Received message from [facility] Act Team RN. She states she is checking on pt who had clinic visit scheduled on 10/30. She would like to know the results of the visit. I called Jadeka back this morning. She is going to fax the Release of Information Form to me when she returns to her office later today. Information can be faxed once that is received.  11/7  1130  Consent for Release of Information received via fax today. Visit note and Drug screen result from 10/30 faxed to [facility] RN as requested.

## 2013-09-03 ENCOUNTER — Ambulatory Visit (INDEPENDENT_AMBULATORY_CARE_PROVIDER_SITE_OTHER): Payer: Medicare Other | Admitting: Family Medicine

## 2013-09-03 VITALS — BP 115/73 | Temp 96.7°F | Wt 153.7 lb

## 2013-09-03 DIAGNOSIS — O99322 Drug use complicating pregnancy, second trimester: Secondary | ICD-10-CM

## 2013-09-03 DIAGNOSIS — F1920 Other psychoactive substance dependence, uncomplicated: Secondary | ICD-10-CM

## 2013-09-03 DIAGNOSIS — O0990 Supervision of high risk pregnancy, unspecified, unspecified trimester: Secondary | ICD-10-CM

## 2013-09-03 DIAGNOSIS — O99323 Drug use complicating pregnancy, third trimester: Secondary | ICD-10-CM

## 2013-09-03 DIAGNOSIS — O99340 Other mental disorders complicating pregnancy, unspecified trimester: Secondary | ICD-10-CM

## 2013-09-03 LAB — POCT URINALYSIS DIP (DEVICE)
Bilirubin Urine: NEGATIVE
Glucose, UA: NEGATIVE mg/dL
Ketones, ur: NEGATIVE mg/dL
Specific Gravity, Urine: >=1.03 (ref 1.005–1.030)

## 2013-09-03 NOTE — Progress Notes (Signed)
Pulse- 83  Pain-sharp belly

## 2013-09-03 NOTE — Patient Instructions (Signed)
Third Trimester of Pregnancy The third trimester is from week 29 through week 42, months 7 through 9. The third trimester is a time when the fetus is growing rapidly. At the end of the ninth month, the fetus is about 20 inches in length and weighs 6 10 pounds.  BODY CHANGES Your body goes through many changes during pregnancy. The changes vary from woman to woman.   Your weight will continue to increase. You can expect to gain 25 35 pounds (11 16 kg) by the end of the pregnancy.  You may begin to get stretch marks on your hips, abdomen, and breasts.  You may urinate more often because the fetus is moving lower into your pelvis and pressing on your bladder.  You may develop or continue to have heartburn as a result of your pregnancy.  You may develop constipation because certain hormones are causing the muscles that push waste through your intestines to slow down.  You may develop hemorrhoids or swollen, bulging veins (varicose veins).  You may have pelvic pain because of the weight gain and pregnancy hormones relaxing your joints between the bones in your pelvis. Back aches may result from over exertion of the muscles supporting your posture.  Your breasts will continue to grow and be tender. A yellow discharge may leak from your breasts called colostrum.  Your belly button may stick out.  You may feel short of breath because of your expanding uterus.  You may notice the fetus "dropping," or moving lower in your abdomen.  You may have a bloody mucus discharge. This usually occurs a few days to a week before labor begins.  Your cervix becomes thin and soft (effaced) near your due date. WHAT TO EXPECT AT YOUR PRENATAL EXAMS  You will have prenatal exams every 2 weeks until week 36. Then, you will have weekly prenatal exams. During a routine prenatal visit:  You will be weighed to make sure you and the fetus are growing normally.  Your blood pressure is taken.  Your abdomen will  be measured to track your baby's growth.  The fetal heartbeat will be listened to.  Any test results from the previous visit will be discussed.  You may have a cervical check near your due date to see if you have effaced. At around 36 weeks, your caregiver will check your cervix. At the same time, your caregiver will also perform a test on the secretions of the vaginal tissue. This test is to determine if a type of bacteria, Group B streptococcus, is present. Your caregiver will explain this further. Your caregiver may ask you:  What your birth plan is.  How you are feeling.  If you are feeling the baby move.  If you have had any abnormal symptoms, such as leaking fluid, bleeding, severe headaches, or abdominal cramping.  If you have any questions. Other tests or screenings that may be performed during your third trimester include:  Blood tests that check for low iron levels (anemia).  Fetal testing to check the health, activity level, and growth of the fetus. Testing is done if you have certain medical conditions or if there are problems during the pregnancy. FALSE LABOR You may feel small, irregular contractions that eventually go away. These are called Braxton Hicks contractions, or false labor. Contractions may last for hours, days, or even weeks before true labor sets in. If contractions come at regular intervals, intensify, or become painful, it is best to be seen by your caregiver.    SIGNS OF LABOR   Menstrual-like cramps.  Contractions that are 5 minutes apart or less.  Contractions that start on the top of the uterus and spread down to the lower abdomen and back.  A sense of increased pelvic pressure or back pain.  A watery or bloody mucus discharge that comes from the vagina. If you have any of these signs before the 37th week of pregnancy, call your caregiver right away. You need to go to the hospital to get checked immediately. HOME CARE INSTRUCTIONS   Avoid all  smoking, herbs, alcohol, and unprescribed drugs. These chemicals affect the formation and growth of the baby.  Follow your caregiver's instructions regarding medicine use. There are medicines that are either safe or unsafe to take during pregnancy.  Exercise only as directed by your caregiver. Experiencing uterine cramps is a good sign to stop exercising.  Continue to eat regular, healthy meals.  Wear a good support bra for breast tenderness.  Do not use hot tubs, steam rooms, or saunas.  Wear your seat belt at all times when driving.  Avoid raw meat, uncooked cheese, cat litter boxes, and soil used by cats. These carry germs that can cause birth defects in the baby.  Take your prenatal vitamins.  Try taking a stool softener (if your caregiver approves) if you develop constipation. Eat more high-fiber foods, such as fresh vegetables or fruit and whole grains. Drink plenty of fluids to keep your urine clear or pale yellow.  Take warm sitz baths to soothe any pain or discomfort caused by hemorrhoids. Use hemorrhoid cream if your caregiver approves.  If you develop varicose veins, wear support hose. Elevate your feet for 15 minutes, 3 4 times a day. Limit salt in your diet.  Avoid heavy lifting, wear low heal shoes, and practice good posture.  Rest a lot with your legs elevated if you have leg cramps or low back pain.  Visit your dentist if you have not gone during your pregnancy. Use a soft toothbrush to brush your teeth and be gentle when you floss.  A sexual relationship may be continued unless your caregiver directs you otherwise.  Do not travel far distances unless it is absolutely necessary and only with the approval of your caregiver.  Take prenatal classes to understand, practice, and ask questions about the labor and delivery.  Make a trial run to the hospital.  Pack your hospital bag.  Prepare the baby's nursery.  Continue to go to all your prenatal visits as directed  by your caregiver. SEEK MEDICAL CARE IF:  You are unsure if you are in labor or if your water has broken.  You have dizziness.  You have mild pelvic cramps, pelvic pressure, or nagging pain in your abdominal area.  You have persistent nausea, vomiting, or diarrhea.  You have a bad smelling vaginal discharge.  You have pain with urination. SEEK IMMEDIATE MEDICAL CARE IF:   You have a fever.  You are leaking fluid from your vagina.  You have spotting or bleeding from your vagina.  You have severe abdominal cramping or pain.  You have rapid weight loss or gain.  You have shortness of breath with chest pain.  You notice sudden or extreme swelling of your face, hands, ankles, feet, or legs.  You have not felt your baby move in over an hour.  You have severe headaches that do not go away with medicine.  You have vision changes. Document Released: 10/05/2001 Document Revised: 06/13/2013 Document Reviewed:   12/12/2012 [employer] Patient Information 2014 [employer].  Breastfeeding Deciding to breastfeed is one of the best choices you can make for you and your baby. A change in hormones during pregnancy causes your breast tissue to grow and increases the number and size of your milk ducts. These hormones also allow proteins, sugars, and fats from your blood supply to make breast milk in your milk-producing glands. Hormones prevent breast milk from being released before your baby is born as well as prompt milk flow after birth. Once breastfeeding has begun, thoughts of your baby, as well as his or her sucking or crying, can stimulate the release of milk from your milk-producing glands.  BENEFITS OF BREASTFEEDING For Your Baby  Your first milk (colostrum) helps your baby's digestive system function better.   There are antibodies in your milk that help your baby fight off infections.   Your baby has a lower incidence of asthma, allergies, and sudden infant death syndrome.    The nutrients in breast milk are better for your baby than infant formulas and are designed uniquely for your baby's needs.   Breast milk improves your baby's brain development.   Your baby is less likely to develop other conditions, such as childhood obesity, asthma, or type 2 diabetes mellitus.  For You   Breastfeeding helps to create a very special bond between you and your baby.   Breastfeeding is convenient. Breast milk is always available at the correct temperature and costs nothing.   Breastfeeding helps to burn calories and helps you lose the weight gained during pregnancy.   Breastfeeding makes your uterus contract to its prepregnancy size faster and slows bleeding (lochia) after you give birth.   Breastfeeding helps to lower your risk of developing type 2 diabetes mellitus, osteoporosis, and breast or ovarian cancer later in life. SIGNS THAT YOUR BABY IS HUNGRY Early Signs of Hunger  Increased alertness or activity.  Stretching.  Movement of the head from side to side.  Movement of the head and opening of the mouth when the corner of the mouth or cheek is stroked (rooting).  Increased sucking sounds, smacking lips, cooing, sighing, or squeaking.  Hand-to-mouth movements.  Increased sucking of fingers or hands. Late Signs of Hunger  Fussing.  Intermittent crying. Extreme Signs of Hunger Signs of extreme hunger will require calming and consoling before your baby will be able to breastfeed successfully. Do not wait for the following signs of extreme hunger to occur before you initiate breastfeeding:   Restlessness.  A loud, strong cry.   Screaming. BREASTFEEDING BASICS Breastfeeding Initiation  Find a comfortable place to sit or lie down, with your neck and back well supported.  Place a pillow or rolled up blanket under your baby to bring him or her to the level of your breast (if you are seated). Nursing pillows are specially designed to help  support your arms and your baby while you breastfeed.  Make sure that your baby's abdomen is facing your abdomen.   Gently massage your breast. With your fingertips, massage from your chest wall toward your nipple in a circular motion. This encourages milk flow. You may need to continue this action during the feeding if your milk flows slowly.  Support your breast with 4 fingers underneath and your thumb above your nipple. Make sure your fingers are well away from your nipple and your baby's mouth.   Stroke your baby's lips gently with your finger or nipple.   When your baby's mouth is   open wide enough, quickly bring your baby to your breast, placing your entire nipple and as much of the colored area around your nipple (areola) as possible into your baby's mouth.   More areola should be visible above your baby's upper lip than below the lower lip.   Your baby's tongue should be between his or her lower gum and your breast.   Ensure that your baby's mouth is correctly positioned around your nipple (latched). Your baby's lips should create a seal on your breast and be turned out (everted).  It is common for your baby to suck about 2 3 minutes in order to start the flow of breast milk. Latching Teaching your baby how to latch on to your breast properly is very important. An improper latch can cause nipple pain and decreased milk supply for you and poor weight gain in your baby. Also, if your baby is not latched onto your nipple properly, he or she may swallow some air during feeding. This can make your baby fussy. Burping your baby when you switch breasts during the feeding can help to get rid of the air. However, teaching your baby to latch on properly is still the best way to prevent fussiness from swallowing air while breastfeeding. Signs that your baby has successfully latched on to your nipple:    Silent tugging or silent sucking, without causing you pain.   Swallowing heard  between every 3 4 sucks.    Muscle movement above and in front of his or her ears while sucking.  Signs that your baby has not successfully latched on to nipple:   Sucking sounds or smacking sounds from your baby while breastfeeding.  Nipple pain. If you think your baby has not latched on correctly, slip your finger into the corner of your baby's mouth to break the suction and place it between your baby's gums. Attempt breastfeeding initiation again. Signs of Successful Breastfeeding Signs from your baby:   A gradual decrease in the number of sucks or complete cessation of sucking.   Falling asleep.   Relaxation of his or her body.   Retention of a small amount of milk in his or her mouth.   Letting go of your breast by himself or herself. Signs from you:  Breasts that have increased in firmness, weight, and size 1 3 hours after feeding.   Breasts that are softer immediately after breastfeeding.  Increased milk volume, as well as a change in milk consistency and color by the 5th day of breastfeeding.   Nipples that are not sore, cracked, or bleeding. Signs That Your Baby is Getting Enough Milk  Wetting at least 3 diapers in a 24-hour period. The urine should be clear and pale yellow by age 5 days.  At least 3 stools in a 24-hour period by age 5 days. The stool should be soft and yellow.  At least 3 stools in a 24-hour period by age 7 days. The stool should be seedy and yellow.  No loss of weight greater than 10% of birth weight during the first 3 days of age.  Average weight gain of 4 7 ounces (120 210 mL) per week after age 4 days.  Consistent daily weight gain by age 5 days, without weight loss after the age of 2 weeks. After a feeding, your baby may spit up a small amount. This is common. BREASTFEEDING FREQUENCY AND DURATION Frequent feeding will help you make more milk and can prevent sore nipples and breast engorgement.   Breastfeed when you feel the need to  reduce the fullness of your breasts or when your baby shows signs of hunger. This is called "breastfeeding on demand." Avoid introducing a pacifier to your baby while you are working to establish breastfeeding (the first 4 6 weeks after your baby is born). After this time you may choose to use a pacifier. Research has shown that pacifier use during the first year of a baby's life decreases the risk of sudden infant death syndrome (SIDS). Allow your baby to feed on each breast as long as he or she wants. Breastfeed until your baby is finished feeding. When your baby unlatches or falls asleep while feeding from the first breast, offer the second breast. Because newborns are often sleepy in the first few weeks of life, you may need to awaken your baby to get him or her to feed. Breastfeeding times will vary from baby to baby. However, the following rules can serve as a guide to help you ensure that your baby is properly fed:  Newborns (babies 4 weeks of age or younger) may breastfeed every 1 3 hours.  Newborns should not go longer than 3 hours during the day or 5 hours during the night without breastfeeding.  You should breastfeed your baby a minimum of 8 times in a 24-hour period until you begin to introduce solid foods to your baby at around 6 months of age. BREAST MILK PUMPING Pumping and storing breast milk allows you to ensure that your baby is exclusively fed your breast milk, even at times when you are unable to breastfeed. This is especially important if you are going back to work while you are still breastfeeding or when you are not able to be present during feedings. Your lactation consultant can give you guidelines on how long it is safe to store breast milk.  A breast pump is a machine that allows you to pump milk from your breast into a sterile bottle. The pumped breast milk can then be stored in a refrigerator or freezer. Some breast pumps are operated by hand, while others use electricity. Ask  your lactation consultant which type will work best for you. Breast pumps can be purchased, but some hospitals and breastfeeding support groups lease breast pumps on a monthly basis. A lactation consultant can teach you how to hand express breast milk, if you prefer not to use a pump.  CARING FOR YOUR BREASTS WHILE YOU BREASTFEED Nipples can become dry, cracked, and sore while breastfeeding. The following recommendations can help keep your breasts moisturized and healthy:  Avoid using soap on your nipples.   Wear a supportive bra. Although not required, special nursing bras and tank tops are designed to allow access to your breasts for breastfeeding without taking off your entire bra or top. Avoid wearing underwire style bras or extremely tight bras.  Air dry your nipples for 3 4minutes after each feeding.   Use only cotton bra pads to absorb leaked breast milk. Leaking of breast milk between feedings is normal.   Use lanolin on your nipples after breastfeeding. Lanolin helps to maintain your skin's normal moisture barrier. If you use pure lanolin you do not need to wash it off before feeding your baby again. Pure lanolin is not toxic to your baby. You may also hand express a few drops of breast milk and gently massage that milk into your nipples and allow the milk to air dry. In the first few weeks after giving birth, some women   experience extremely full breasts (engorgement). Engorgement can make your breasts feel heavy, warm, and tender to the touch. Engorgement peaks within 3 5 days after you give birth. The following recommendations can help ease engorgement:  Completely empty your breasts while breastfeeding or pumping. You may want to start by applying warm, moist heat (in the shower or with warm water-soaked hand towels) just before feeding or pumping. This increases circulation and helps the milk flow. If your baby does not completely empty your breasts while breastfeeding, pump any extra  milk after he or she is finished.  Wear a snug bra (nursing or regular) or tank top for 1 2 days to signal your body to slightly decrease milk production.  Apply ice packs to your breasts, unless this is too uncomfortable for you.  Make sure that your baby is latched on and positioned properly while breastfeeding. If engorgement persists after 48 hours of following these recommendations, contact your health care provider or a lactation consultant. OVERALL HEALTH CARE RECOMMENDATIONS WHILE BREASTFEEDING  Eat healthy foods. Alternate between meals and snacks, eating 3 of each per day. Because what you eat affects your breast milk, some of the foods may make your baby more irritable than usual. Avoid eating these foods if you are sure that they are negatively affecting your baby.  Drink milk, fruit juice, and water to satisfy your thirst (about 10 glasses a day).   Rest often, relax, and continue to take your prenatal vitamins to prevent fatigue, stress, and anemia.  Continue breast self-awareness checks.  Avoid chewing and smoking tobacco.  Avoid alcohol and drug use. Some medicines that may be harmful to your baby can pass through breast milk. It is important to ask your health care provider before taking any medicine, including all over-the-counter and prescription medicine as well as vitamin and herbal supplements. It is possible to become pregnant while breastfeeding. If birth control is desired, ask your health care provider about options that will be safe for your baby. SEEK MEDICAL CARE IF:   You feel like you want to stop breastfeeding or have become frustrated with breastfeeding.  You have painful breasts or nipples.  Your nipples are cracked or bleeding.  Your breasts are red, tender, or warm.  You have a swollen area on either breast.  You have a fever or chills.  You have nausea or vomiting.  You have drainage other than breast milk from your nipples.  Your breasts  do not become full before feedings by the 5th day after you give birth.  You feel sad and depressed.  Your baby is too sleepy to eat well.  Your baby is having trouble sleeping.   Your baby is wetting less than 3 diapers in a 24-hour period.  Your baby has less than 3 stools in a 24-hour period.  Your baby's skin or the white part of his or her eyes becomes yellow.   Your baby is not gaining weight by 5 days of age. SEEK IMMEDIATE MEDICAL CARE IF:   Your baby is overly tired (lethargic) and does not want to wake up and feed.  Your baby develops an unexplained fever. Document Released: 10/11/2005 Document Revised: 06/13/2013 Document Reviewed: 04/04/2013 [employer] Patient Information 2014 [employer].  

## 2013-09-03 NOTE — Progress Notes (Signed)
Labs next visit Still states not feeling baby move. Needs TDaP and 28 wk labs and RPR titer. Urine culture secondary to 2+ protein today

## 2013-09-04 LAB — PRESCRIPTION MONITORING PROFILE (19 PANEL)
Amphetamine/Meth: NEGATIVE ng/mL
Barbiturate Screen, Urine: NEGATIVE ng/mL
Carisoprodol, Urine: NEGATIVE ng/mL
Creatinine, Urine: 206.43 mg/dL (ref >20.0)
Methadone Screen, Urine: NEGATIVE ng/mL
Nitrites, Initial: NEGATIVE ug/mL
Opiate Screen, Urine: NEGATIVE ng/mL
Oxycodone Screen, Ur: NEGATIVE ng/mL
Tapentadol, urine: NEGATIVE ng/mL
Tramadol Scrn, Ur: NEGATIVE ng/mL
pH, Initial: 6.6 pH (ref 4.5–8.9)

## 2013-09-04 LAB — ALCOHOL METABOLITE (ETG), URINE: Ethyl Glucuronide (EtG): NEGATIVE ng/mL

## 2013-09-17 ENCOUNTER — Encounter: Payer: Self-pay | Admitting: Family Medicine

## 2013-09-17 ENCOUNTER — Ambulatory Visit (INDEPENDENT_AMBULATORY_CARE_PROVIDER_SITE_OTHER): Payer: Medicare Other | Admitting: Family Medicine

## 2013-09-17 ENCOUNTER — Encounter: Payer: Self-pay | Admitting: *Deleted

## 2013-09-17 VITALS — BP 123/67 | Temp 98.8°F | Wt 153.1 lb

## 2013-09-17 DIAGNOSIS — O99322 Drug use complicating pregnancy, second trimester: Secondary | ICD-10-CM

## 2013-09-17 DIAGNOSIS — O09529 Supervision of elderly multigravida, unspecified trimester: Secondary | ICD-10-CM

## 2013-09-17 DIAGNOSIS — Z23 Encounter for immunization: Secondary | ICD-10-CM

## 2013-09-17 DIAGNOSIS — O09523 Supervision of elderly multigravida, third trimester: Secondary | ICD-10-CM

## 2013-09-17 DIAGNOSIS — O99323 Drug use complicating pregnancy, third trimester: Secondary | ICD-10-CM

## 2013-09-17 DIAGNOSIS — F1920 Other psychoactive substance dependence, uncomplicated: Secondary | ICD-10-CM

## 2013-09-17 LAB — POCT URINALYSIS DIP (DEVICE)
Bilirubin Urine: NEGATIVE
Glucose, UA: NEGATIVE mg/dL
Nitrite: NEGATIVE
Urobilinogen, UA: 1 mg/dL (ref 0.0–1.0)
pH: 7 (ref 5.0–8.0)

## 2013-09-17 NOTE — Progress Notes (Signed)
Failed to get glucola down--will attempt again next visit. TDaP today

## 2013-09-17 NOTE — Progress Notes (Signed)
Pulse- 87 Pt did not complete glucose test but will reschedule to do 28 week labs.  May need to do jelly bean test

## 2013-09-17 NOTE — Patient Instructions (Signed)
Third Trimester of Pregnancy The third trimester is from week 29 through week 42, months 7 through 9. The third trimester is a time when the fetus is growing rapidly. At the end of the ninth month, the fetus is about 20 inches in length and weighs 6 10 pounds.  BODY CHANGES Your body goes through many changes during pregnancy. The changes vary from woman to woman.   Your weight will continue to increase. You can expect to gain 25 35 pounds (11 16 kg) by the end of the pregnancy.  You may begin to get stretch marks on your hips, abdomen, and breasts.  You may urinate more often because the fetus is moving lower into your pelvis and pressing on your bladder.  You may develop or continue to have heartburn as a result of your pregnancy.  You may develop constipation because certain hormones are causing the muscles that push waste through your intestines to slow down.  You may develop hemorrhoids or swollen, bulging veins (varicose veins).  You may have pelvic pain because of the weight gain and pregnancy hormones relaxing your joints between the bones in your pelvis. Back aches may result from over exertion of the muscles supporting your posture.  Your breasts will continue to grow and be tender. A yellow discharge may leak from your breasts called colostrum.  Your belly button may stick out.  You may feel short of breath because of your expanding uterus.  You may notice the fetus "dropping," or moving lower in your abdomen.  You may have a bloody mucus discharge. This usually occurs a few days to a week before labor begins.  Your cervix becomes thin and soft (effaced) near your due date. WHAT TO EXPECT AT YOUR PRENATAL EXAMS  You will have prenatal exams every 2 weeks until week 36. Then, you will have weekly prenatal exams. During a routine prenatal visit:  You will be weighed to make sure you and the fetus are growing normally.  Your blood pressure is taken.  Your abdomen will  be measured to track your baby's growth.  The fetal heartbeat will be listened to.  Any test results from the previous visit will be discussed.  You may have a cervical check near your due date to see if you have effaced. At around 36 weeks, your caregiver will check your cervix. At the same time, your caregiver will also perform a test on the secretions of the vaginal tissue. This test is to determine if a type of bacteria, Group B streptococcus, is present. Your caregiver will explain this further. Your caregiver may ask you:  What your birth plan is.  How you are feeling.  If you are feeling the baby move.  If you have had any abnormal symptoms, such as leaking fluid, bleeding, severe headaches, or abdominal cramping.  If you have any questions. Other tests or screenings that may be performed during your third trimester include:  Blood tests that check for low iron levels (anemia).  Fetal testing to check the health, activity level, and growth of the fetus. Testing is done if you have certain medical conditions or if there are problems during the pregnancy. FALSE LABOR You may feel small, irregular contractions that eventually go away. These are called Braxton Hicks contractions, or false labor. Contractions may last for hours, days, or even weeks before true labor sets in. If contractions come at regular intervals, intensify, or become painful, it is best to be seen by your caregiver.    SIGNS OF LABOR   Menstrual-like cramps.  Contractions that are 5 minutes apart or less.  Contractions that start on the top of the uterus and spread down to the lower abdomen and back.  A sense of increased pelvic pressure or back pain.  A watery or bloody mucus discharge that comes from the vagina. If you have any of these signs before the 37th week of pregnancy, call your caregiver right away. You need to go to the hospital to get checked immediately. HOME CARE INSTRUCTIONS   Avoid all  smoking, herbs, alcohol, and unprescribed drugs. These chemicals affect the formation and growth of the baby.  Follow your caregiver's instructions regarding medicine use. There are medicines that are either safe or unsafe to take during pregnancy.  Exercise only as directed by your caregiver. Experiencing uterine cramps is a good sign to stop exercising.  Continue to eat regular, healthy meals.  Wear a good support bra for breast tenderness.  Do not use hot tubs, steam rooms, or saunas.  Wear your seat belt at all times when driving.  Avoid raw meat, uncooked cheese, cat litter boxes, and soil used by cats. These carry germs that can cause birth defects in the baby.  Take your prenatal vitamins.  Try taking a stool softener (if your caregiver approves) if you develop constipation. Eat more high-fiber foods, such as fresh vegetables or fruit and whole grains. Drink plenty of fluids to keep your urine clear or pale yellow.  Take warm sitz baths to soothe any pain or discomfort caused by hemorrhoids. Use hemorrhoid cream if your caregiver approves.  If you develop varicose veins, wear support hose. Elevate your feet for 15 minutes, 3 4 times a day. Limit salt in your diet.  Avoid heavy lifting, wear low heal shoes, and practice good posture.  Rest a lot with your legs elevated if you have leg cramps or low back pain.  Visit your dentist if you have not gone during your pregnancy. Use a soft toothbrush to brush your teeth and be gentle when you floss.  A sexual relationship may be continued unless your caregiver directs you otherwise.  Do not travel far distances unless it is absolutely necessary and only with the approval of your caregiver.  Take prenatal classes to understand, practice, and ask questions about the labor and delivery.  Make a trial run to the hospital.  Pack your hospital bag.  Prepare the baby's nursery.  Continue to go to all your prenatal visits as directed  by your caregiver. SEEK MEDICAL CARE IF:  You are unsure if you are in labor or if your water has broken.  You have dizziness.  You have mild pelvic cramps, pelvic pressure, or nagging pain in your abdominal area.  You have persistent nausea, vomiting, or diarrhea.  You have a bad smelling vaginal discharge.  You have pain with urination. SEEK IMMEDIATE MEDICAL CARE IF:   You have a fever.  You are leaking fluid from your vagina.  You have spotting or bleeding from your vagina.  You have severe abdominal cramping or pain.  You have rapid weight loss or gain.  You have shortness of breath with chest pain.  You notice sudden or extreme swelling of your face, hands, ankles, feet, or legs.  You have not felt your baby move in over an hour.  You have severe headaches that do not go away with medicine.  You have vision changes. Document Released: 10/05/2001 Document Revised: 06/13/2013 Document Reviewed:   12/12/2012 [employer] Patient Information 2014 [employer].  Breastfeeding Deciding to breastfeed is one of the best choices you can make for you and your baby. A change in hormones during pregnancy causes your breast tissue to grow and increases the number and size of your milk ducts. These hormones also allow proteins, sugars, and fats from your blood supply to make breast milk in your milk-producing glands. Hormones prevent breast milk from being released before your baby is born as well as prompt milk flow after birth. Once breastfeeding has begun, thoughts of your baby, as well as his or her sucking or crying, can stimulate the release of milk from your milk-producing glands.  BENEFITS OF BREASTFEEDING For Your Baby  Your first milk (colostrum) helps your baby's digestive system function better.   There are antibodies in your milk that help your baby fight off infections.   Your baby has a lower incidence of asthma, allergies, and sudden infant death syndrome.    The nutrients in breast milk are better for your baby than infant formulas and are designed uniquely for your baby's needs.   Breast milk improves your baby's brain development.   Your baby is less likely to develop other conditions, such as childhood obesity, asthma, or type 2 diabetes mellitus.  For You   Breastfeeding helps to create a very special bond between you and your baby.   Breastfeeding is convenient. Breast milk is always available at the correct temperature and costs nothing.   Breastfeeding helps to burn calories and helps you lose the weight gained during pregnancy.   Breastfeeding makes your uterus contract to its prepregnancy size faster and slows bleeding (lochia) after you give birth.   Breastfeeding helps to lower your risk of developing type 2 diabetes mellitus, osteoporosis, and breast or ovarian cancer later in life. SIGNS THAT YOUR BABY IS HUNGRY Early Signs of Hunger  Increased alertness or activity.  Stretching.  Movement of the head from side to side.  Movement of the head and opening of the mouth when the corner of the mouth or cheek is stroked (rooting).  Increased sucking sounds, smacking lips, cooing, sighing, or squeaking.  Hand-to-mouth movements.  Increased sucking of fingers or hands. Late Signs of Hunger  Fussing.  Intermittent crying. Extreme Signs of Hunger Signs of extreme hunger will require calming and consoling before your baby will be able to breastfeed successfully. Do not wait for the following signs of extreme hunger to occur before you initiate breastfeeding:   Restlessness.  A loud, strong cry.   Screaming. BREASTFEEDING BASICS Breastfeeding Initiation  Find a comfortable place to sit or lie down, with your neck and back well supported.  Place a pillow or rolled up blanket under your baby to bring him or her to the level of your breast (if you are seated). Nursing pillows are specially designed to help  support your arms and your baby while you breastfeed.  Make sure that your baby's abdomen is facing your abdomen.   Gently massage your breast. With your fingertips, massage from your chest wall toward your nipple in a circular motion. This encourages milk flow. You may need to continue this action during the feeding if your milk flows slowly.  Support your breast with 4 fingers underneath and your thumb above your nipple. Make sure your fingers are well away from your nipple and your baby's mouth.   Stroke your baby's lips gently with your finger or nipple.   When your baby's mouth is   open wide enough, quickly bring your baby to your breast, placing your entire nipple and as much of the colored area around your nipple (areola) as possible into your baby's mouth.   More areola should be visible above your baby's upper lip than below the lower lip.   Your baby's tongue should be between his or her lower gum and your breast.   Ensure that your baby's mouth is correctly positioned around your nipple (latched). Your baby's lips should create a seal on your breast and be turned out (everted).  It is common for your baby to suck about 2 3 minutes in order to start the flow of breast milk. Latching Teaching your baby how to latch on to your breast properly is very important. An improper latch can cause nipple pain and decreased milk supply for you and poor weight gain in your baby. Also, if your baby is not latched onto your nipple properly, he or she may swallow some air during feeding. This can make your baby fussy. Burping your baby when you switch breasts during the feeding can help to get rid of the air. However, teaching your baby to latch on properly is still the best way to prevent fussiness from swallowing air while breastfeeding. Signs that your baby has successfully latched on to your nipple:    Silent tugging or silent sucking, without causing you pain.   Swallowing heard  between every 3 4 sucks.    Muscle movement above and in front of his or her ears while sucking.  Signs that your baby has not successfully latched on to nipple:   Sucking sounds or smacking sounds from your baby while breastfeeding.  Nipple pain. If you think your baby has not latched on correctly, slip your finger into the corner of your baby's mouth to break the suction and place it between your baby's gums. Attempt breastfeeding initiation again. Signs of Successful Breastfeeding Signs from your baby:   A gradual decrease in the number of sucks or complete cessation of sucking.   Falling asleep.   Relaxation of his or her body.   Retention of a small amount of milk in his or her mouth.   Letting go of your breast by himself or herself. Signs from you:  Breasts that have increased in firmness, weight, and size 1 3 hours after feeding.   Breasts that are softer immediately after breastfeeding.  Increased milk volume, as well as a change in milk consistency and color by the 5th day of breastfeeding.   Nipples that are not sore, cracked, or bleeding. Signs That Your Baby is Getting Enough Milk  Wetting at least 3 diapers in a 24-hour period. The urine should be clear and pale yellow by age 5 days.  At least 3 stools in a 24-hour period by age 5 days. The stool should be soft and yellow.  At least 3 stools in a 24-hour period by age 7 days. The stool should be seedy and yellow.  No loss of weight greater than 10% of birth weight during the first 3 days of age.  Average weight gain of 4 7 ounces (120 210 mL) per week after age 4 days.  Consistent daily weight gain by age 5 days, without weight loss after the age of 2 weeks. After a feeding, your baby may spit up a small amount. This is common. BREASTFEEDING FREQUENCY AND DURATION Frequent feeding will help you make more milk and can prevent sore nipples and breast engorgement.   Breastfeed when you feel the need to  reduce the fullness of your breasts or when your baby shows signs of hunger. This is called "breastfeeding on demand." Avoid introducing a pacifier to your baby while you are working to establish breastfeeding (the first 4 6 weeks after your baby is born). After this time you may choose to use a pacifier. Research has shown that pacifier use during the first year of a baby's life decreases the risk of sudden infant death syndrome (SIDS). Allow your baby to feed on each breast as long as he or she wants. Breastfeed until your baby is finished feeding. When your baby unlatches or falls asleep while feeding from the first breast, offer the second breast. Because newborns are often sleepy in the first few weeks of life, you may need to awaken your baby to get him or her to feed. Breastfeeding times will vary from baby to baby. However, the following rules can serve as a guide to help you ensure that your baby is properly fed:  Newborns (babies 4 weeks of age or younger) may breastfeed every 1 3 hours.  Newborns should not go longer than 3 hours during the day or 5 hours during the night without breastfeeding.  You should breastfeed your baby a minimum of 8 times in a 24-hour period until you begin to introduce solid foods to your baby at around 6 months of age. BREAST MILK PUMPING Pumping and storing breast milk allows you to ensure that your baby is exclusively fed your breast milk, even at times when you are unable to breastfeed. This is especially important if you are going back to work while you are still breastfeeding or when you are not able to be present during feedings. Your lactation consultant can give you guidelines on how long it is safe to store breast milk.  A breast pump is a machine that allows you to pump milk from your breast into a sterile bottle. The pumped breast milk can then be stored in a refrigerator or freezer. Some breast pumps are operated by hand, while others use electricity. Ask  your lactation consultant which type will work best for you. Breast pumps can be purchased, but some hospitals and breastfeeding support groups lease breast pumps on a monthly basis. A lactation consultant can teach you how to hand express breast milk, if you prefer not to use a pump.  CARING FOR YOUR BREASTS WHILE YOU BREASTFEED Nipples can become dry, cracked, and sore while breastfeeding. The following recommendations can help keep your breasts moisturized and healthy:  Avoid using soap on your nipples.   Wear a supportive bra. Although not required, special nursing bras and tank tops are designed to allow access to your breasts for breastfeeding without taking off your entire bra or top. Avoid wearing underwire style bras or extremely tight bras.  Air dry your nipples for 3 4minutes after each feeding.   Use only cotton bra pads to absorb leaked breast milk. Leaking of breast milk between feedings is normal.   Use lanolin on your nipples after breastfeeding. Lanolin helps to maintain your skin's normal moisture barrier. If you use pure lanolin you do not need to wash it off before feeding your baby again. Pure lanolin is not toxic to your baby. You may also hand express a few drops of breast milk and gently massage that milk into your nipples and allow the milk to air dry. In the first few weeks after giving birth, some women   experience extremely full breasts (engorgement). Engorgement can make your breasts feel heavy, warm, and tender to the touch. Engorgement peaks within 3 5 days after you give birth. The following recommendations can help ease engorgement:  Completely empty your breasts while breastfeeding or pumping. You may want to start by applying warm, moist heat (in the shower or with warm water-soaked hand towels) just before feeding or pumping. This increases circulation and helps the milk flow. If your baby does not completely empty your breasts while breastfeeding, pump any extra  milk after he or she is finished.  Wear a snug bra (nursing or regular) or tank top for 1 2 days to signal your body to slightly decrease milk production.  Apply ice packs to your breasts, unless this is too uncomfortable for you.  Make sure that your baby is latched on and positioned properly while breastfeeding. If engorgement persists after 48 hours of following these recommendations, contact your health care provider or a lactation consultant. OVERALL HEALTH CARE RECOMMENDATIONS WHILE BREASTFEEDING  Eat healthy foods. Alternate between meals and snacks, eating 3 of each per day. Because what you eat affects your breast milk, some of the foods may make your baby more irritable than usual. Avoid eating these foods if you are sure that they are negatively affecting your baby.  Drink milk, fruit juice, and water to satisfy your thirst (about 10 glasses a day).   Rest often, relax, and continue to take your prenatal vitamins to prevent fatigue, stress, and anemia.  Continue breast self-awareness checks.  Avoid chewing and smoking tobacco.  Avoid alcohol and drug use. Some medicines that may be harmful to your baby can pass through breast milk. It is important to ask your health care provider before taking any medicine, including all over-the-counter and prescription medicine as well as vitamin and herbal supplements. It is possible to become pregnant while breastfeeding. If birth control is desired, ask your health care provider about options that will be safe for your baby. SEEK MEDICAL CARE IF:   You feel like you want to stop breastfeeding or have become frustrated with breastfeeding.  You have painful breasts or nipples.  Your nipples are cracked or bleeding.  Your breasts are red, tender, or warm.  You have a swollen area on either breast.  You have a fever or chills.  You have nausea or vomiting.  You have drainage other than breast milk from your nipples.  Your breasts  do not become full before feedings by the 5th day after you give birth.  You feel sad and depressed.  Your baby is too sleepy to eat well.  Your baby is having trouble sleeping.   Your baby is wetting less than 3 diapers in a 24-hour period.  Your baby has less than 3 stools in a 24-hour period.  Your baby's skin or the white part of his or her eyes becomes yellow.   Your baby is not gaining weight by 5 days of age. SEEK IMMEDIATE MEDICAL CARE IF:   Your baby is overly tired (lethargic) and does not want to wake up and feed.  Your baby develops an unexplained fever. Document Released: 10/11/2005 Document Revised: 06/13/2013 Document Reviewed: 04/04/2013 [employer] Patient Information 2014 [employer].  

## 2013-09-24 LAB — COCAINE METABOLITE (GC/LC/MS), URINE: Benzoylecgonine GC/MS Conf: 172 ng/mL — AB

## 2013-09-25 ENCOUNTER — Encounter: Payer: Self-pay | Admitting: Family Medicine

## 2013-09-25 LAB — PRESCRIPTION MONITORING PROFILE (19 PANEL)
Amphetamine/Meth: NEGATIVE ng/mL
Barbiturate Screen, Urine: NEGATIVE ng/mL
Benzodiazepine Screen, Urine: NEGATIVE ng/mL
Buprenorphine, Urine: NEGATIVE ng/mL
Cannabinoid Scrn, Ur: NEGATIVE ng/mL
Carisoprodol, Urine: NEGATIVE ng/mL
Creatinine, Urine: 202.91 mg/dL
Fentanyl, Ur: NEGATIVE ng/mL
MDMA URINE: NEGATIVE ng/mL
Meperidine, Ur: NEGATIVE ng/mL
Methadone Screen, Urine: NEGATIVE ng/mL
Methaqualone: NEGATIVE ng/mL
Nitrites, Initial: NEGATIVE ug/mL
Opiate Screen, Urine: NEGATIVE ng/mL
Oxycodone Screen, Ur: NEGATIVE ng/mL
Phencyclidine, Ur: NEGATIVE ng/mL
Propoxyphene: NEGATIVE ng/mL
Tapentadol, urine: NEGATIVE ng/mL
Tramadol Scrn, Ur: NEGATIVE ng/mL
Zolpidem, Urine: NEGATIVE ng/mL
pH, Initial: 7.3 pH (ref 4.5–8.9)

## 2013-10-01 ENCOUNTER — Ambulatory Visit (INDEPENDENT_AMBULATORY_CARE_PROVIDER_SITE_OTHER): Payer: Medicare Other | Admitting: Family Medicine

## 2013-10-01 VITALS — BP 115/69 | Temp 97.9°F | Wt 158.3 lb

## 2013-10-01 DIAGNOSIS — O09529 Supervision of elderly multigravida, unspecified trimester: Secondary | ICD-10-CM

## 2013-10-01 DIAGNOSIS — O99323 Drug use complicating pregnancy, third trimester: Secondary | ICD-10-CM

## 2013-10-01 DIAGNOSIS — O09523 Supervision of elderly multigravida, third trimester: Secondary | ICD-10-CM

## 2013-10-01 DIAGNOSIS — F1920 Other psychoactive substance dependence, uncomplicated: Secondary | ICD-10-CM

## 2013-10-01 LAB — POCT URINALYSIS DIP (DEVICE)
Ketones, ur: NEGATIVE mg/dL
Protein, ur: NEGATIVE mg/dL
Specific Gravity, Urine: 1.02 (ref 1.005–1.030)
Urobilinogen, UA: 0.2 mg/dL (ref 0.0–1.0)
pH: 7 (ref 5.0–8.0)

## 2013-10-01 LAB — CBC
HCT: 32 % — ABNORMAL LOW (ref 36.0–46.0)
Hemoglobin: 11 g/dL — ABNORMAL LOW (ref 12.0–15.0)
MCH: 30.3 pg (ref 26.0–34.0)
MCHC: 34.4 g/dL (ref 30.0–36.0)
RBC: 3.63 MIL/uL — ABNORMAL LOW (ref 3.87–5.11)

## 2013-10-01 NOTE — Progress Notes (Signed)
[id number] 

## 2013-10-01 NOTE — Progress Notes (Signed)
Glucola and 28 wk labs today. Doing well. Has found stable living environment in a group home. To discuss contraception options at next visit.

## 2013-10-01 NOTE — Patient Instructions (Signed)
Third Trimester of Pregnancy  The third trimester is from week 29 through week 42, months 7 through 9. The third trimester is a time when the fetus is growing rapidly. At the end of the ninth month, the fetus is about 20 inches in length and weighs 6 10 pounds.   BODY CHANGES  Your body goes through many changes during pregnancy. The changes vary from woman to woman.    Your weight will continue to increase. You can expect to gain 25 35 pounds (11 16 kg) by the end of the pregnancy.   You may begin to get stretch marks on your hips, abdomen, and breasts.   You may urinate more often because the fetus is moving lower into your pelvis and pressing on your bladder.   You may develop or continue to have heartburn as a result of your pregnancy.   You may develop constipation because certain hormones are causing the muscles that push waste through your intestines to slow down.   You may develop hemorrhoids or swollen, bulging veins (varicose veins).   You may have pelvic pain because of the weight gain and pregnancy hormones relaxing your joints between the bones in your pelvis. Back aches may result from over exertion of the muscles supporting your posture.   Your breasts will continue to grow and be tender. A yellow discharge may leak from your breasts called colostrum.   Your belly button may stick out.   You may feel short of breath because of your expanding uterus.   You may notice the fetus "dropping," or moving lower in your abdomen.   You may have a bloody mucus discharge. This usually occurs a few days to a week before labor begins.   Your cervix becomes thin and soft (effaced) near your due date.  WHAT TO EXPECT AT YOUR PRENATAL EXAMS   You will have prenatal exams every 2 weeks until week 36. Then, you will have weekly prenatal exams. During a routine prenatal visit:   You will be weighed to make sure you and the fetus are growing normally.   Your blood pressure is taken.   Your abdomen will be  measured to track your baby's growth.   The fetal heartbeat will be listened to.   Any test results from the previous visit will be discussed.   You may have a cervical check near your due date to see if you have effaced.  At around 36 weeks, your caregiver will check your cervix. At the same time, your caregiver will also perform a test on the secretions of the vaginal tissue. This test is to determine if a type of bacteria, Group B streptococcus, is present. Your caregiver will explain this further.  Your caregiver may ask you:   What your birth plan is.   How you are feeling.   If you are feeling the baby move.   If you have had any abnormal symptoms, such as leaking fluid, bleeding, severe headaches, or abdominal cramping.   If you have any questions.  Other tests or screenings that may be performed during your third trimester include:   Blood tests that check for low iron levels (anemia).   Fetal testing to check the health, activity level, and growth of the fetus. Testing is done if you have certain medical conditions or if there are problems during the pregnancy.  FALSE LABOR  You may feel small, irregular contractions that eventually go away. These are called Braxton Hicks contractions, or   false labor. Contractions may last for hours, days, or even weeks before true labor sets in. If contractions come at regular intervals, intensify, or become painful, it is best to be seen by your caregiver.   SIGNS OF LABOR    Menstrual-like cramps.   Contractions that are 5 minutes apart or less.   Contractions that start on the top of the uterus and spread down to the lower abdomen and back.   A sense of increased pelvic pressure or back pain.   A watery or bloody mucus discharge that comes from the vagina.  If you have any of these signs before the 37th week of pregnancy, call your caregiver right away. You need to go to the hospital to get checked immediately.  HOME CARE INSTRUCTIONS    Avoid all  smoking, herbs, alcohol, and unprescribed drugs. These chemicals affect the formation and growth of the baby.   Follow your caregiver's instructions regarding medicine use. There are medicines that are either safe or unsafe to take during pregnancy.   Exercise only as directed by your caregiver. Experiencing uterine cramps is a good sign to stop exercising.   Continue to eat regular, healthy meals.   Wear a good support bra for breast tenderness.   Do not use hot tubs, steam rooms, or saunas.   Wear your seat belt at all times when driving.   Avoid raw meat, uncooked cheese, cat litter boxes, and soil used by cats. These carry germs that can cause birth defects in the baby.   Take your prenatal vitamins.   Try taking a stool softener (if your caregiver approves) if you develop constipation. Eat more high-fiber foods, such as fresh vegetables or fruit and whole grains. Drink plenty of fluids to keep your urine clear or pale yellow.   Take warm sitz baths to soothe any pain or discomfort caused by hemorrhoids. Use hemorrhoid cream if your caregiver approves.   If you develop varicose veins, wear support hose. Elevate your feet for 15 minutes, 3 4 times a day. Limit salt in your diet.   Avoid heavy lifting, wear low heal shoes, and practice good posture.   Rest a lot with your legs elevated if you have leg cramps or low back pain.   Visit your dentist if you have not gone during your pregnancy. Use a soft toothbrush to brush your teeth and be gentle when you floss.   A sexual relationship may be continued unless your caregiver directs you otherwise.   Do not travel far distances unless it is absolutely necessary and only with the approval of your caregiver.   Take prenatal classes to understand, practice, and ask questions about the labor and delivery.   Make a trial run to the hospital.   Pack your hospital bag.   Prepare the baby's nursery.   Continue to go to all your prenatal visits as directed  by your caregiver.  SEEK MEDICAL CARE IF:   You are unsure if you are in labor or if your water has broken.   You have dizziness.   You have mild pelvic cramps, pelvic pressure, or nagging pain in your abdominal area.   You have persistent nausea, vomiting, or diarrhea.   You have a bad smelling vaginal discharge.   You have pain with urination.  SEEK IMMEDIATE MEDICAL CARE IF:    You have a fever.   You are leaking fluid from your vagina.   You have spotting or bleeding from your vagina.     You have severe abdominal cramping or pain.   You have rapid weight loss or gain.   You have shortness of breath with chest pain.   You notice sudden or extreme swelling of your face, hands, ankles, feet, or legs.   You have not felt your baby move in over an hour.   You have severe headaches that do not go away with medicine.   You have vision changes.  Document Released: 10/05/2001 Document Revised: 06/13/2013 Document Reviewed: 12/12/2012  [employer] Patient Information 2014 [employer]

## 2013-10-02 ENCOUNTER — Encounter: Payer: Self-pay | Admitting: Family Medicine

## 2013-10-02 LAB — RPR: RPR Ser Ql: REACTIVE — AB

## 2013-10-02 LAB — PRESCRIPTION MONITORING PROFILE (19 PANEL)
Amphetamine/Meth: NEGATIVE ng/mL
Barbiturate Screen, Urine: NEGATIVE ng/mL
Benzodiazepine Screen, Urine: NEGATIVE ng/mL
Buprenorphine, Urine: NEGATIVE ng/mL
Cannabinoid Scrn, Ur: NEGATIVE ng/mL
Cocaine Metabolites: NEGATIVE ng/mL
Fentanyl, Ur: NEGATIVE ng/mL
Opiate Screen, Urine: NEGATIVE ng/mL
Oxycodone Screen, Ur: NEGATIVE ng/mL
Phencyclidine, Ur: NEGATIVE ng/mL
Tapentadol, urine: NEGATIVE ng/mL
Zolpidem, Urine: NEGATIVE ng/mL

## 2013-10-02 LAB — T.PALLIDUM AB, TOTAL: T pallidum Antibodies (TP-PA): >8 S/CO — ABNORMAL HIGH (ref <0.90)

## 2013-10-02 LAB — HIV ANTIBODY (ROUTINE TESTING W REFLEX): HIV: NONREACTIVE

## 2013-10-02 LAB — RPR TITER: RPR Titer: 1:1 {titer}

## 2013-10-16 ENCOUNTER — Ambulatory Visit (INDEPENDENT_AMBULATORY_CARE_PROVIDER_SITE_OTHER): Payer: Medicare Other | Admitting: Family Medicine

## 2013-10-16 ENCOUNTER — Encounter: Payer: Self-pay | Admitting: Family Medicine

## 2013-10-16 VITALS — BP 122/77 | Temp 97.0°F | Wt 160.4 lb

## 2013-10-16 DIAGNOSIS — O09529 Supervision of elderly multigravida, unspecified trimester: Secondary | ICD-10-CM

## 2013-10-16 DIAGNOSIS — O99323 Drug use complicating pregnancy, third trimester: Secondary | ICD-10-CM

## 2013-10-16 DIAGNOSIS — O09523 Supervision of elderly multigravida, third trimester: Secondary | ICD-10-CM

## 2013-10-16 DIAGNOSIS — F1920 Other psychoactive substance dependence, uncomplicated: Secondary | ICD-10-CM

## 2013-10-16 LAB — POCT URINALYSIS DIP (DEVICE)
Hgb urine dipstick: NEGATIVE
Nitrite: NEGATIVE
Protein, ur: NEGATIVE mg/dL
Urobilinogen, UA: 1 mg/dL (ref 0.0–1.0)
pH: 6.5 (ref 5.0–8.0)

## 2013-10-16 NOTE — Patient Instructions (Addendum)
Third Trimester of Pregnancy The third trimester is from week 29 through week 42, months 7 through 9. The third trimester is a time when the fetus is growing rapidly. At the end of the ninth month, the fetus is about 20 inches in length and weighs 6 10 pounds.  BODY CHANGES Your body goes through many changes during pregnancy. The changes vary from woman to woman.   Your weight will continue to increase. You can expect to gain 25 35 pounds (11 16 kg) by the end of the pregnancy.  You may begin to get stretch marks on your hips, abdomen, and breasts.  You may urinate more often because the fetus is moving lower into your pelvis and pressing on your bladder.  You may develop or continue to have heartburn as a result of your pregnancy.  You may develop constipation because certain hormones are causing the muscles that push waste through your intestines to slow down.  You may develop hemorrhoids or swollen, bulging veins (varicose veins).  You may have pelvic pain because of the weight gain and pregnancy hormones relaxing your joints between the bones in your pelvis. Back aches may result from over exertion of the muscles supporting your posture.  Your breasts will continue to grow and be tender. A yellow discharge may leak from your breasts called colostrum.  Your belly button may stick out.  You may feel short of breath because of your expanding uterus.  You may notice the fetus "dropping," or moving lower in your abdomen.  You may have a bloody mucus discharge. This usually occurs a few days to a week before labor begins.  Your cervix becomes thin and soft (effaced) near your due date. WHAT TO EXPECT AT YOUR PRENATAL EXAMS  You will have prenatal exams every 2 weeks until week 36. Then, you will have weekly prenatal exams. During a routine prenatal visit:  You will be weighed to make sure you and the fetus are growing normally.  Your blood pressure is taken.  Your abdomen will  be measured to track your baby's growth.  The fetal heartbeat will be listened to.  Any test results from the previous visit will be discussed.  You may have a cervical check near your due date to see if you have effaced. At around 36 weeks, your caregiver will check your cervix. At the same time, your caregiver will also perform a test on the secretions of the vaginal tissue. This test is to determine if a type of bacteria, Group B streptococcus, is present. Your caregiver will explain this further. Your caregiver may ask you:  What your birth plan is.  How you are feeling.  If you are feeling the baby move.  If you have had any abnormal symptoms, such as leaking fluid, bleeding, severe headaches, or abdominal cramping.  If you have any questions. Other tests or screenings that may be performed during your third trimester include:  Blood tests that check for low iron levels (anemia).  Fetal testing to check the health, activity level, and growth of the fetus. Testing is done if you have certain medical conditions or if there are problems during the pregnancy. FALSE LABOR You may feel small, irregular contractions that eventually go away. These are called Braxton Hicks contractions, or false labor. Contractions may last for hours, days, or even weeks before true labor sets in. If contractions come at regular intervals, intensify, or become painful, it is best to be seen by your caregiver.    SIGNS OF LABOR   Menstrual-like cramps.  Contractions that are 5 minutes apart or less.  Contractions that start on the top of the uterus and spread down to the lower abdomen and back.  A sense of increased pelvic pressure or back pain.  A watery or bloody mucus discharge that comes from the vagina. If you have any of these signs before the 37th week of pregnancy, call your caregiver right away. You need to go to the hospital to get checked immediately. HOME CARE INSTRUCTIONS   Avoid all  smoking, herbs, alcohol, and unprescribed drugs. These chemicals affect the formation and growth of the baby.  Follow your caregiver's instructions regarding medicine use. There are medicines that are either safe or unsafe to take during pregnancy.  Exercise only as directed by your caregiver. Experiencing uterine cramps is a good sign to stop exercising.  Continue to eat regular, healthy meals.  Wear a good support bra for breast tenderness.  Do not use hot tubs, steam rooms, or saunas.  Wear your seat belt at all times when driving.  Avoid raw meat, uncooked cheese, cat litter boxes, and soil used by cats. These carry germs that can cause birth defects in the baby.  Take your prenatal vitamins.  Try taking a stool softener (if your caregiver approves) if you develop constipation. Eat more high-fiber foods, such as fresh vegetables or fruit and whole grains. Drink plenty of fluids to keep your urine clear or pale yellow.  Take warm sitz baths to soothe any pain or discomfort caused by hemorrhoids. Use hemorrhoid cream if your caregiver approves.  If you develop varicose veins, wear support hose. Elevate your feet for 15 minutes, 3 4 times a day. Limit salt in your diet.  Avoid heavy lifting, wear low heal shoes, and practice good posture.  Rest a lot with your legs elevated if you have leg cramps or low back pain.  Visit your dentist if you have not gone during your pregnancy. Use a soft toothbrush to brush your teeth and be gentle when you floss.  A sexual relationship may be continued unless your caregiver directs you otherwise.  Do not travel far distances unless it is absolutely necessary and only with the approval of your caregiver.  Take prenatal classes to understand, practice, and ask questions about the labor and delivery.  Make a trial run to the hospital.  Pack your hospital bag.  Prepare the baby's nursery.  Continue to go to all your prenatal visits as directed  by your caregiver. SEEK MEDICAL CARE IF:  You are unsure if you are in labor or if your water has broken.  You have dizziness.  You have mild pelvic cramps, pelvic pressure, or nagging pain in your abdominal area.  You have persistent nausea, vomiting, or diarrhea.  You have a bad smelling vaginal discharge.  You have pain with urination. SEEK IMMEDIATE MEDICAL CARE IF:   You have a fever.  You are leaking fluid from your vagina.  You have spotting or bleeding from your vagina.  You have severe abdominal cramping or pain.  You have rapid weight loss or gain.  You have shortness of breath with chest pain.  You notice sudden or extreme swelling of your face, hands, ankles, feet, or legs.  You have not felt your baby move in over an hour.  You have severe headaches that do not go away with medicine.  You have vision changes. Document Released: 10/05/2001 Document Revised: 06/13/2013 Document Reviewed:   12/12/2012 [employer] Patient Information 2014 [place], [place]. Vaginal Delivery During delivery, your health care provider will help you give birth to your baby. During a vaginal delivery, you will work to push the baby out of your vagina. However, before you can push your baby out, a few things need to happen. The opening of your uterus (cervix) has to soften, thin out, and open up (dilate) all the way to 10 cm. Also, your baby has to move down from the uterus into your vagina.  SIGNS OF LABOR  Your health care provider will first need to make sure you are in labor. Signs of labor include:   Passing what is called the mucous plug before labor begins. This is a small amount of blood-stained mucus.   Having regular, painful uterine contractions.   The time between contractions gets shorter.   The discomfort and pain gradually get more intense.  Contraction pains get worse when walking and do not go away when resting.   Your cervix becomes thinner (effacement) and  dilates. BEFORE THE DELIVERY Once you are in labor and admitted into the hospital or care center, your health care provider may do the following:   Perform a complete physical exam.  Review any complications related to pregnancy or labor.  Check your blood pressure, pulse, temperature, and heart rate (vital signs).   Determine if, and when, the rupture of amniotic membranes occurred.  Do a vaginal exam (using a sterile glove and lubricant) to determine:   The position (presentation) of the baby. Is the baby's head presenting first (vertex) in the birth canal (vagina), or are the feet or buttocks first (breech)?   The level (station) of the baby's head within the birth canal.   The effacement and dilatation of the cervix.   An electronic fetal monitor is usually placed on your abdomen when you first arrive. This is used to monitor your contractions and the baby's heart rate.  When the monitor is on your abdomen (external fetal monitor), it can only pick up the frequency and length of your contractions. It cannot tell the strength of your contractions.  If it becomes necessary for your health care provider to know exactly how strong your contractions are or to see exactly what the baby's heart rate is doing, an internal monitor may be inserted into your vagina and uterus. Your health care provider will discuss the benefits and risks of using an internal monitor and obtain your permission before inserting the device.  Continuous fetal monitoring may be needed if you have an epidural, are receiving certain medicines (such as oxytocin), or have pregnancy or labor complications.  An IV access tube may be placed into a vein in your arm to deliver fluids and medicines if necessary. THREE STAGES OF LABOR AND DELIVERY Normal labor and delivery is divided into three stages. First Stage This stage starts when you begin to contract regularly and your cervix begins to efface and dilate. It  ends when your cervix is completely open (fully dilated). The first stage is the longest stage of labor and can last from 3 hours to 15 hours.  Several methods are available to help with labor pain. You and your health care provider will decide which option is best for you. Options include:   Opioid medicines. These are strong pain medicines that you can get through your IV tube or as a shot into your muscle. These medicines lessen pain but do not make it go away completely.  Epidural.  A medicine is given through a thin tube that is inserted in your back. The medicine numbs the lower part of your body and prevents any pain in that area.  Paracervical pain medicine. This is an injection of an anesthetic on each side of your cervix.   You may request natural childbirth, which does not involve the use of pain medicines or an epidural during labor and delivery. Instead, you will use other things, such as breathing exercises, to help cope with the pain. Second Stage The second stage of labor begins when your cervix is fully dilated at 10 cm. It continues until you push your baby down through the birth canal and the baby is born. This stage can take only minutes or several hours.  The location of your baby's head as it moves through the birth canal is reported as a number called a station. If the baby's head has not started its descent, the station is described as being at minus 3 ( 3). When your baby's head is at the zero station, it is at the middle of the birth canal and is engaged in the pelvis. The station of your baby helps indicate the progress of the second stage of labor.  When your baby is born, your health care provider may hold the baby with his or her head lowered to prevent amniotic fluid, mucus, and blood from getting into the baby's lungs. The baby's mouth and nose may be suctioned with a small bulb syringe to remove any additional fluid.  Your health care provider may then place the  baby on your stomach. It is important to keep the baby from getting cold. To do this, the health care provider will dry the baby off, place the baby directly on your skin (with no blankets between you and the baby), and cover the baby with warm, dry blankets.   The umbilical cord is cut. Third Stage During the third stage of labor, your health care provider will deliver the placenta (afterbirth) and make sure your bleeding is under control. The delivery of the placenta usually takes about 5 minutes but can take up to 30 minutes. After the placenta is delivered, a medicine may be given either by IV or injection to help contract the uterus and control bleeding. If you are planning to breastfeed, you can try to do so now. After you deliver the placenta, your uterus should contract and get very firm. If your uterus does not remain firm, your health care provider will massage it. This is important because the contraction of the uterus helps cut off bleeding at the site where the placenta was attached to your uterus. If your uterus does not contract properly and stay firm, you may continue to bleed heavily. If there is a lot of bleeding, medicines may be given to contract the uterus and stop the bleeding.  Document Released: 07/20/2008 Document Revised: 06/13/2013 Document Reviewed: 04/01/2013 [facility] Patient Information 2014 [place], [place].

## 2013-10-16 NOTE — Progress Notes (Signed)
Doing well--passed 1 hour. Continue working on contraception methods. Discussed labor and what that might be like.

## 2013-10-16 NOTE — Progress Notes (Signed)
Pulse- 87  Pain-epigastric

## 2013-10-17 LAB — PRESCRIPTION MONITORING PROFILE (19 PANEL)
Buprenorphine, Urine: NEGATIVE ng/mL
Cannabinoid Scrn, Ur: NEGATIVE ng/mL
Carisoprodol, Urine: NEGATIVE ng/mL
Creatinine, Urine: 125.05 mg/dL (ref >20.0)
MDMA URINE: NEGATIVE ng/mL
Meperidine, Ur: NEGATIVE ng/mL
Methadone Screen, Urine: NEGATIVE ng/mL
Methaqualone: NEGATIVE ng/mL
Nitrites, Initial: NEGATIVE ug/mL
Oxycodone Screen, Ur: NEGATIVE ng/mL
Phencyclidine, Ur: NEGATIVE ng/mL

## 2013-10-25 NOTE — L&D Delivery Note (Signed)
Delivery Note At 1:16 PM a viable female was delivered via Vaginal, Spontaneous Delivery (Presentation: ; Occiput Anterior).  APGAR: 9, 9; weight 5 lb 12.8 oz (2630 g).   Placenta status: Intact, Spontaneous.  Cord: 3 vessels with the following complications: None.  Cord pH:    Anesthesia: None  Episiotomy: None Lacerations: None Suture Repair:   Est. Blood Loss (mL): 300  Mom to postpartum.  Baby to Couplet care / Skin to Skin.  Deepak Bless 12/12/2013, 2:05 PM

## 2013-10-30 ENCOUNTER — Encounter: Payer: Self-pay | Admitting: Obstetrics & Gynecology

## 2013-10-30 ENCOUNTER — Ambulatory Visit (INDEPENDENT_AMBULATORY_CARE_PROVIDER_SITE_OTHER): Payer: Medicare Other | Admitting: Obstetrics & Gynecology

## 2013-10-30 VITALS — BP 134/70 | Temp 97.2°F | Wt 163.4 lb

## 2013-10-30 DIAGNOSIS — O09529 Supervision of elderly multigravida, unspecified trimester: Secondary | ICD-10-CM

## 2013-10-30 DIAGNOSIS — O09523 Supervision of elderly multigravida, third trimester: Secondary | ICD-10-CM

## 2013-10-30 DIAGNOSIS — Z3483 Encounter for supervision of other normal pregnancy, third trimester: Secondary | ICD-10-CM

## 2013-10-30 LAB — POCT URINALYSIS DIP (DEVICE)
GLUCOSE, UA: NEGATIVE mg/dL
Nitrite: NEGATIVE
Protein, ur: 30 mg/dL — AB
Urobilinogen, UA: 0.2 mg/dL (ref 0.0–1.0)
pH: 5.5 (ref 5.0–8.0)

## 2013-10-30 NOTE — Patient Instructions (Signed)
Group B Streptococcus Infection During Pregnancy Group B streptococcus (GBS) is a type of bacteria often found in healthy women. GBS usually does not cause any symptoms or harm to healthy adult women, but the bacteria can make a baby very sick if it is passed to the baby during childbirth. GBS is not a sexually transmitted disease (STD). GBS is different from Group A streptococcus, the bacteria that causes "strep throat." CAUSES  GBS bacteria can be found in the intestinal, reproductive, and urinary tracts of women. It can also be found in the female genital tract, most often in the vagina and rectal areas.  SYMPTOMS  In pregnancy, GBS can be in the following places:  Genital tract without symptoms.  Rectum without symptoms.  Urine with or without symptoms (asymptomatic bacteriuria).  Urinary symptoms can include pain, frequency, urgency, and blood with urination (cystitis). Pregnant women who are infected with GBS are at increased risk of:  Early (premature) labor and delivery.  Prolonged rupture of the membranes.  Infection in the following places:  Bladder.  Kidneys (pyelonephritis).  Bag of waters or placenta (chorioamnionitis).  Uterus (endometritis) after delivery. Newborns who are infected with GBS can develop:  Lung infection (pneumonia).  Blood infection (septicemia).  Infection of the lining of the brain and spinal cord (meningitis). DIAGNOSIS  Diagnosis of GBS infection is made by screening tests done when you are 35 to [redacted] weeks pregnant. The test (culture) is an easy swab of the vagina and rectum. A sample of your urine might also be checked for the bacteria. Talk with your caregiver about a plan for labor if your test shows that you carry the GBS bacteria. TREATMENT  If results of the culture are positive, showing that GBS is present, you likely will receive treatment with antibiotic medicines during labor. This will help prevent GBS from being passed to your baby.  The antibiotics work only if they are given during labor. If treatment is given earlier in pregnancy, the bacteria may regrow and be present during labor. Tell your caregiver if you are allergic to penicillin or other antibiotics. Antibiotics are also given if:   Infection of the membranes (amnionitis) is suspected.  Labor begins or there is rupture of the membranes before 37 weeks of pregnancy and there is a high risk of delivering the baby.  The mother has a past history of giving birth to an infant with GBS infection.  The culture status is unknown (culture not performed or result not available) and there is:  Fever during labor.  Preterm labor (less than 37 weeks of pregnancy).  Prolonged rupture of membranes (18 hours or more). Treatment of the mother during labor is not recommended when:  A planned cesarean delivery is done and there is no labor or ruptured membranes. This is true even if the mother tested positive for GBS.  There is a negative culture for GBS screening during the pregnancy, regardless of the risk factors during labor. The infant will receive antibiotics if the infant tested positive for GBS or has signs and symptoms that suggest GBS infection is present. It is not recommended to routinely give antibiotics to infants whose mothers received antibiotic treatment during labor. HOME CARE INSTRUCTIONS   Take all antibiotics as prescribed by your caregiver.  Only take medicine as directed by your caregiver.  Continue with prenatal visits and care.  Return for follow-up appointments and cultures.  Follow your caregiver's instructions. SEEK MEDICAL CARE IF:   You have pain with urination.    You have frequent urination.  You have blood in your urine. SEEK IMMEDIATE MEDICAL CARE IF:  You have a fever.  You have pain in the back, side, or uterus.  You have chills.  You have abdominal swelling (distension) or pain.  You have labor pains (contractions) every  10 minutes or more often.  You are leaking fluid or bleeding from your vagina.  You have pelvic pressure that feels like your baby is pushing down.  You have a low, dull backache.  You have cramps that feel like your period.  You have abdominal cramps with or without diarrhea.  You have repeated vomiting and diarrhea.  You have trouble breathing.  You develop confusion.  You have stiffness of your body or neck. MAKE SURE YOU:   Understand these instructions.  Will watch your condition.  Will get help right away if you are not doing well or get worse. Document Released: 01/18/2008 Document Revised: 01/03/2012 Document Reviewed: 02/21/2011 [employer] Patient Information 2014 [employer].  

## 2013-10-30 NOTE — Progress Notes (Signed)
For GBS next visit No problems this visit Need social work consult after delivery

## 2013-10-30 NOTE — Progress Notes (Signed)
Pulse: 108

## 2013-11-13 ENCOUNTER — Encounter: Payer: Medicare Other | Admitting: Obstetrics and Gynecology

## 2013-11-15 ENCOUNTER — Ambulatory Visit (INDEPENDENT_AMBULATORY_CARE_PROVIDER_SITE_OTHER): Payer: Medicare Other | Admitting: Obstetrics & Gynecology

## 2013-11-15 VITALS — BP 125/76 | Temp 97.3°F | Wt 165.7 lb

## 2013-11-15 DIAGNOSIS — Z8619 Personal history of other infectious and parasitic diseases: Secondary | ICD-10-CM

## 2013-11-15 DIAGNOSIS — O0990 Supervision of high risk pregnancy, unspecified, unspecified trimester: Secondary | ICD-10-CM

## 2013-11-15 DIAGNOSIS — O99340 Other mental disorders complicating pregnancy, unspecified trimester: Secondary | ICD-10-CM

## 2013-11-15 DIAGNOSIS — O09529 Supervision of elderly multigravida, unspecified trimester: Secondary | ICD-10-CM

## 2013-11-15 DIAGNOSIS — Z113 Encounter for screening for infections with a predominantly sexual mode of transmission: Secondary | ICD-10-CM

## 2013-11-15 DIAGNOSIS — R82998 Other abnormal findings in urine: Secondary | ICD-10-CM

## 2013-11-15 DIAGNOSIS — O98119 Syphilis complicating pregnancy, unspecified trimester: Secondary | ICD-10-CM

## 2013-11-15 DIAGNOSIS — F1910 Other psychoactive substance abuse, uncomplicated: Secondary | ICD-10-CM

## 2013-11-15 DIAGNOSIS — O99320 Drug use complicating pregnancy, unspecified trimester: Secondary | ICD-10-CM

## 2013-11-15 LAB — POCT URINALYSIS DIP (DEVICE)
BILIRUBIN URINE: NEGATIVE
GLUCOSE, UA: NEGATIVE mg/dL
HGB URINE DIPSTICK: NEGATIVE
KETONES UR: NEGATIVE mg/dL
LEUKOCYTES UA: NEGATIVE
Nitrite: NEGATIVE
PH: 7 (ref 5.0–8.0)
Protein, ur: NEGATIVE mg/dL
Specific Gravity, Urine: 1.02 (ref 1.005–1.030)
Urobilinogen, UA: 1 mg/dL (ref 0.0–1.0)

## 2013-11-15 LAB — OB RESULTS CONSOLE GC/CHLAMYDIA
CHLAMYDIA, DNA PROBE: NEGATIVE
Gonorrhea: NEGATIVE

## 2013-11-15 LAB — OB RESULTS CONSOLE GBS: STREP GROUP B AG: NEGATIVE

## 2013-11-15 NOTE — Progress Notes (Signed)
P=88,  

## 2013-11-15 NOTE — Addendum Note (Signed)
Addended by: Gerome ApleyZEYFANG, LINDA L on: 11/15/2013 12:14 PM   Modules accepted: Orders

## 2013-11-15 NOTE — Progress Notes (Signed)
Cultures today.  Pt refuses BTL.  Psych RN says she is deemed competent by a psych MD.  Considering depo.

## 2013-11-16 LAB — GC/CHLAMYDIA PROBE AMP
CT PROBE, AMP APTIMA: NEGATIVE
GC PROBE AMP APTIMA: NEGATIVE

## 2013-11-17 LAB — CULTURE, BETA STREP (GROUP B ONLY)

## 2013-11-20 ENCOUNTER — Inpatient Hospital Stay (HOSPITAL_COMMUNITY)
Admission: AD | Admit: 2013-11-20 | Discharge: 2013-11-20 | Disposition: A | Discharge status: Home / Self Care | Payer: Medicare Other | Source: Ambulatory Visit | Attending: Obstetrics & Gynecology | Admitting: Obstetrics & Gynecology

## 2013-11-20 ENCOUNTER — Encounter (HOSPITAL_COMMUNITY): Payer: Self-pay | Admitting: *Deleted

## 2013-11-20 DIAGNOSIS — O09529 Supervision of elderly multigravida, unspecified trimester: Secondary | ICD-10-CM

## 2013-11-20 DIAGNOSIS — R109 Unspecified abdominal pain: Secondary | ICD-10-CM | POA: Insufficient documentation

## 2013-11-20 DIAGNOSIS — O9989 Other specified diseases and conditions complicating pregnancy, childbirth and the puerperium: Principal | ICD-10-CM

## 2013-11-20 DIAGNOSIS — O99891 Other specified diseases and conditions complicating pregnancy: Secondary | ICD-10-CM | POA: Insufficient documentation

## 2013-11-20 DIAGNOSIS — O99340 Other mental disorders complicating pregnancy, unspecified trimester: Secondary | ICD-10-CM

## 2013-11-20 DIAGNOSIS — O0990 Supervision of high risk pregnancy, unspecified, unspecified trimester: Secondary | ICD-10-CM

## 2013-11-20 DIAGNOSIS — O99320 Drug use complicating pregnancy, unspecified trimester: Secondary | ICD-10-CM

## 2013-11-20 NOTE — MAU Note (Signed)
Pains in stomach, come and go.  Started around 1100. No bleeding or leaking.

## 2013-11-20 NOTE — Progress Notes (Signed)
Dr Reola CalkinsBeck on unit and aware of pt's admission and status. Aware of sve and strip reviewed

## 2013-11-20 NOTE — Progress Notes (Signed)
Unable to feel presenting part due to pt sliding up in bed during exam

## 2013-11-20 NOTE — Progress Notes (Signed)
[facility]

## 2013-11-20 NOTE — Discharge Instructions (Signed)
Polyethylene Glycol powder What is this medicine? POLYETHYLENE GLYCOL 3350 (pol ee ETH i leen; GLYE col) powder is a laxative used to treat constipation. It increases the amount of water in the stool. Bowel movements become easier and more frequent. This medicine may be used for other purposes; ask your health care provider or pharmacist if you have questions. COMMON BRAND NAME(S): GaviLax, GlycoLax, MiraLax, [employer]  What should I tell my health care provider before I take this medicine? They need to know if you have any of these conditions: -a history of blockage of the stomach or intestine -current abdomen distension or pain -difficulty swallowing -diverticulitis, ulcerative colitis, or other chronic bowel disease -phenylketonuria -an unusual or allergic reaction to polyethylene glycol, other medicines, dyes, or preservatives -pregnant or trying to get pregnant -breast-feeding How should I use this medicine? Take this medicine by mouth. The bottle has a measuring cap that is marked with a line. Pour the powder into the cap up to the marked line (the dose is about 1 heaping tablespoon). Add the powder in the cap to a full glass (4 to 8 ounces or 120 to 240 ml) of water, juice, soda, coffee or tea. Mix the powder well. Drink the solution. Take exactly as directed. Do not take your medicine more often than directed. Talk to your pediatrician regarding the use of this medicine in children. Special care may be needed. Overdosage: If you think you have taken too much of this medicine contact a poison control center or emergency room at once. NOTE: This medicine is only for you. Do not share this medicine with others. What if I miss a dose? If you miss a dose, take it as soon as you can. If it is almost time for your next dose, take only that dose. Do not take double or extra doses. What may interact with this medicine? Interactions are not expected. This list may not describe all possible  interactions. Give your health care provider a list of all the medicines, herbs, non-prescription drugs, or dietary supplements you use. Also tell them if you smoke, drink alcohol, or use illegal drugs. Some items may interact with your medicine. What should I watch for while using this medicine? Do not use for more than 2 weeks without advice from your doctor or health care professional. It can take 2 to 4 days to have a bowel movement and to experience improvement in constipation. See your health care professional for any changes in bowel habits, including constipation, that are severe or last longer than three weeks. Always take this medicine with plenty of water. What side effects may I notice from receiving this medicine? Side effects that you should report to your doctor or health care professional as soon as possible: -diarrhea -difficulty breathing -itching of the skin, hives, or skin rash -severe bloating, pain, or distension of the stomach -vomiting Side effects that usually do not require medical attention (report to your doctor or health care professional if they continue or are bothersome): -bloating or gas -lower abdominal discomfort or cramps -nausea This list may not describe all possible side effects. Call your doctor for medical advice about side effects. You may report side effects to FDA at 1-800-FDA-1088. Where should I keep my medicine? Keep out of the reach of children. Store between 15 and 30 degrees C (59 and 86 degrees F). Throw away any unused medicine after the expiration date. NOTE: This sheet is a summary. It may not cover all possible information.  If you have questions about this medicine, talk to your doctor, pharmacist, or health care provider.  2014, Elsevier/Gold Standard. (2008-05-13 16:50:45) Deberah Pelton Contractions Pregnancy is commonly associated with contractions of the uterus throughout the pregnancy. Towards the end of pregnancy (32 to 34 weeks),  these contractions [facility] Willa Rough) can develop more often and may become more forceful. This is not true labor because these contractions do not result in opening (dilatation) and thinning of the cervix. They are sometimes difficult to tell apart from true labor because these contractions can be forceful and people have different pain tolerances. You should not feel embarrassed if you go to the hospital with false labor. Sometimes, the only way to tell if you are in true labor is for your caregiver to follow the changes in the cervix. How to tell the difference between true and false labor:  False labor.  The contractions of false labor are usually shorter, irregular and not as hard as those of true labor.  They are often felt in the front of the lower abdomen and in the groin.  They may leave with walking around or changing positions while lying down.  They get weaker and are shorter lasting as time goes on.  These contractions are usually irregular.  They do not usually become progressively stronger, regular and closer together as with true labor.  True labor.  Contractions in true labor last 30 to 70 seconds, become very regular, usually become more intense, and increase in frequency.  They do not go away with walking.  The discomfort is usually felt in the top of the uterus and spreads to the lower abdomen and low back.  True labor can be determined by your caregiver with an exam. This will show that the cervix is dilating and getting thinner. If there are no prenatal problems or other health problems associated with the pregnancy, it is completely safe to be sent home with false labor and await the onset of true labor. HOME CARE INSTRUCTIONS   Keep up with your usual exercises and instructions.  Take medications as directed.  Keep your regular prenatal appointment.  Eat and drink lightly if you think you are going into labor.  If BH contractions are making you  uncomfortable:  Change your activity position from lying down or resting to walking/walking to resting.  Sit and rest in a tub of warm water.  Drink 2 to 3 glasses of water. Dehydration may cause B-H contractions.  Do slow and deep breathing several times an hour. SEEK IMMEDIATE MEDICAL CARE IF:   Your contractions continue to become stronger, more regular, and closer together.  You have a gushing, burst or leaking of fluid from the vagina.  An oral temperature above 102 F (38.9 C) develops.  You have passage of blood-tinged mucus.  You develop vaginal bleeding.  You develop continuous belly (abdominal) pain.  You have low back pain that you never had before.  You feel the baby's head pushing down causing pelvic pressure.  The baby is not moving as much as it used to. Document Released: 10/11/2005 Document Revised: 01/03/2012 Document Reviewed: 07/23/2013 [facility] Patient Information 2014 [place], [place].

## 2013-11-22 ENCOUNTER — Ambulatory Visit (INDEPENDENT_AMBULATORY_CARE_PROVIDER_SITE_OTHER): Payer: Medicare Other | Admitting: Obstetrics & Gynecology

## 2013-11-22 VITALS — BP 120/68 | Temp 97.2°F | Wt 167.5 lb

## 2013-11-22 DIAGNOSIS — O09529 Supervision of elderly multigravida, unspecified trimester: Secondary | ICD-10-CM

## 2013-11-22 DIAGNOSIS — O99340 Other mental disorders complicating pregnancy, unspecified trimester: Secondary | ICD-10-CM

## 2013-11-22 DIAGNOSIS — F1910 Other psychoactive substance abuse, uncomplicated: Secondary | ICD-10-CM

## 2013-11-22 DIAGNOSIS — O99320 Drug use complicating pregnancy, unspecified trimester: Secondary | ICD-10-CM

## 2013-11-22 DIAGNOSIS — F209 Schizophrenia, unspecified: Secondary | ICD-10-CM

## 2013-11-22 LAB — POCT URINALYSIS DIP (DEVICE)
Bilirubin Urine: NEGATIVE
Glucose, UA: 250 mg/dL — AB
Hgb urine dipstick: NEGATIVE
Ketones, ur: NEGATIVE mg/dL
Nitrite: NEGATIVE
Protein, ur: NEGATIVE mg/dL
SPECIFIC GRAVITY, URINE: 1.02 (ref 1.005–1.030)
Urobilinogen, UA: 1 mg/dL (ref 0.0–1.0)
pH: 6.5 (ref 5.0–8.0)

## 2013-11-22 NOTE — Patient Instructions (Signed)
Return to clinic for any obstetric concerns or go to MAU for evaluation  

## 2013-11-22 NOTE — Progress Notes (Signed)
No ocomplaints or concerns.  Fetal movement and labor precautions reviewed.

## 2013-11-22 NOTE — Progress Notes (Signed)
P - 81 

## 2013-11-23 LAB — PRESCRIPTION MONITORING PROFILE (19 PANEL)
Amphetamine/Meth: NEGATIVE ng/mL
Barbiturate Screen, Urine: NEGATIVE ng/mL
Benzodiazepine Screen, Urine: NEGATIVE ng/mL
Buprenorphine, Urine: NEGATIVE ng/mL
CANNABINOID SCRN UR: NEGATIVE ng/mL
COCAINE METABOLITES: NEGATIVE ng/mL
CREATININE, URINE: 134.29 mg/dL (ref >20.0)
Carisoprodol, Urine: NEGATIVE ng/mL
FENTANYL URINE: NEGATIVE ng/mL
MDMA URINE: NEGATIVE ng/mL
Meperidine, Ur: NEGATIVE ng/mL
Methadone Screen, Urine: NEGATIVE ng/mL
Methaqualone: NEGATIVE ng/mL
Nitrites, Initial: NEGATIVE ug/mL
OXYCODONE SCRN UR: NEGATIVE ng/mL
Opiate Screen, Urine: NEGATIVE ng/mL
Phencyclidine, Ur: NEGATIVE ng/mL
Propoxyphene: NEGATIVE ng/mL
Tapentadol, urine: NEGATIVE ng/mL
Tramadol Scrn, Ur: NEGATIVE ng/mL
ZOLPIDEM, URINE: NEGATIVE ng/mL
pH, Initial: 6.8 pH (ref 4.5–8.9)

## 2013-11-29 ENCOUNTER — Ambulatory Visit (INDEPENDENT_AMBULATORY_CARE_PROVIDER_SITE_OTHER): Payer: Medicare Other | Admitting: Obstetrics and Gynecology

## 2013-11-29 ENCOUNTER — Encounter: Payer: Self-pay | Admitting: Obstetrics and Gynecology

## 2013-11-29 VITALS — BP 131/77 | Temp 97.6°F | Wt 166.6 lb

## 2013-11-29 DIAGNOSIS — O99320 Drug use complicating pregnancy, unspecified trimester: Secondary | ICD-10-CM

## 2013-11-29 DIAGNOSIS — O0990 Supervision of high risk pregnancy, unspecified, unspecified trimester: Secondary | ICD-10-CM

## 2013-11-29 DIAGNOSIS — O99340 Other mental disorders complicating pregnancy, unspecified trimester: Secondary | ICD-10-CM

## 2013-11-29 DIAGNOSIS — F209 Schizophrenia, unspecified: Secondary | ICD-10-CM

## 2013-11-29 DIAGNOSIS — O98119 Syphilis complicating pregnancy, unspecified trimester: Secondary | ICD-10-CM

## 2013-11-29 DIAGNOSIS — O09529 Supervision of elderly multigravida, unspecified trimester: Secondary | ICD-10-CM

## 2013-11-29 DIAGNOSIS — F1910 Other psychoactive substance abuse, uncomplicated: Secondary | ICD-10-CM

## 2013-11-29 LAB — POCT URINALYSIS DIP (DEVICE)
BILIRUBIN URINE: NEGATIVE
Glucose, UA: 100 mg/dL — AB
Hgb urine dipstick: NEGATIVE
Nitrite: NEGATIVE
PROTEIN: NEGATIVE mg/dL
SPECIFIC GRAVITY, URINE: 1.02 (ref 1.005–1.030)
Urobilinogen, UA: 1 mg/dL (ref 0.0–1.0)
pH: 6.5 (ref 5.0–8.0)

## 2013-11-29 NOTE — Patient Instructions (Signed)
Contraception Choices Contraception (birth control) is the use of any methods or devices to prevent pregnancy. Below are some methods to help avoid pregnancy. HORMONAL METHODS   Contraceptive implant This is a thin, plastic tube containing progesterone hormone. It does not contain estrogen hormone. Your health care provider inserts the tube in the inner part of the upper arm. The tube can remain in place for up to 3 years. After 3 years, the implant must be removed. The implant prevents the ovaries from releasing an egg (ovulation), thickens the cervical mucus to prevent sperm from entering the uterus, and thins the lining of the inside of the uterus.  Progesterone-only injections These injections are given every 3 months by your health care provider to prevent pregnancy. This synthetic progesterone hormone stops the ovaries from releasing eggs. It also thickens cervical mucus and changes the uterine lining. This makes it harder for sperm to survive in the uterus.  Birth control pills These pills contain estrogen and progesterone hormone. They work by preventing the ovaries from releasing eggs (ovulation). They also cause the cervical mucus to thicken, preventing the sperm from entering the uterus. Birth control pills are prescribed by a health care provider.Birth control pills can also be used to treat heavy periods.  Minipill This type of birth control pill contains only the progesterone hormone. They are taken every day of each month and must be prescribed by your health care provider.  Birth control patch The patch contains hormones similar to those in birth control pills. It must be changed once a week and is prescribed by a health care provider.  Vaginal ring The ring contains hormones similar to those in birth control pills. It is left in the vagina for 3 weeks, removed for 1 week, and then a new one is put back in place. The patient must be comfortable inserting and removing the ring from the  vagina.A health care provider's prescription is necessary.  Emergency contraception Emergency contraceptives prevent pregnancy after unprotected sexual intercourse. This pill can be taken right after sex or up to 5 days after unprotected sex. It is most effective the sooner you take the pills after having sexual intercourse. Most emergency contraceptive pills are available without a prescription. Check with your pharmacist. Do not use emergency contraception as your only form of birth control. BARRIER METHODS   Female condom This is a thin sheath (latex or rubber) that is worn over the penis during sexual intercourse. It can be used with spermicide to increase effectiveness.  Female condom. This is a soft, loose-fitting sheath that is put into the vagina before sexual intercourse.  Diaphragm This is a soft, latex, dome-shaped barrier that must be fitted by a health care provider. It is inserted into the vagina, along with a spermicidal jelly. It is inserted before intercourse. The diaphragm should be left in the vagina for 6 to 8 hours after intercourse.  Cervical cap This is a round, soft, latex or plastic cup that fits over the cervix and must be fitted by a health care provider. The cap can be left in place for up to 48 hours after intercourse.  Sponge This is a soft, circular piece of polyurethane foam. The sponge has spermicide in it. It is inserted into the vagina after wetting it and before sexual intercourse.  Spermicides These are chemicals that kill or block sperm from entering the cervix and uterus. They come in the form of creams, jellies, suppositories, foam, or tablets. They do not require a   prescription. They are inserted into the vagina with an applicator before having sexual intercourse. The process must be repeated every time you have sexual intercourse. INTRAUTERINE CONTRACEPTION  Intrauterine device (IUD) This is a T-shaped device that is put in a woman's uterus during a  menstrual period to prevent pregnancy. There are 2 types:  Copper IUD This type of IUD is wrapped in copper wire and is placed inside the uterus. Copper makes the uterus and fallopian tubes produce a fluid that kills sperm. It can stay in place for 10 years.  Hormone IUD This type of IUD contains the hormone progestin (synthetic progesterone). The hormone thickens the cervical mucus and prevents sperm from entering the uterus, and it also thins the uterine lining to prevent implantation of a fertilized egg. The hormone can weaken or kill the sperm that get into the uterus. It can stay in place for 3 5 years, depending on which type of IUD is used. PERMANENT METHODS OF CONTRACEPTION  Female tubal ligation This is when the woman's fallopian tubes are surgically sealed, tied, or blocked to prevent the egg from traveling to the uterus.  Hysteroscopic sterilization This involves placing a small coil or insert into each fallopian tube. Your doctor uses a technique called hysteroscopy to do the procedure. The device causes scar tissue to form. This results in permanent blockage of the fallopian tubes, so the sperm cannot fertilize the egg. It takes about 3 months after the procedure for the tubes to become blocked. You must use another form of birth control for these 3 months.  Female sterilization This is when the female has the tubes that carry sperm tied off (vasectomy).This blocks sperm from entering the vagina during sexual intercourse. After the procedure, the man can still ejaculate fluid (semen). NATURAL PLANNING METHODS  Natural family planning This is not having sexual intercourse or using a barrier method (condom, diaphragm, cervical cap) on days the woman could become pregnant.  Calendar method This is keeping track of the length of each menstrual cycle and identifying when you are fertile.  Ovulation method This is avoiding sexual intercourse during ovulation.  Symptothermal method This is  avoiding sexual intercourse during ovulation, using a thermometer and ovulation symptoms.  Post ovulation method This is timing sexual intercourse after you have ovulated. Regardless of which type or method of contraception you choose, it is important that you use condoms to protect against the transmission of sexually transmitted infections (STIs). Talk with your health care provider about which form of contraception is most appropriate for you. Document Released: 10/11/2005 Document Revised: 06/13/2013 Document Reviewed: 04/05/2013 [employer] Patient Information 2014 [employer].  

## 2013-11-29 NOTE — Progress Notes (Signed)
Patient is doing well without any complaints. FM/labor precautions discussed. Patient is considering depo-provera. She does not desire further pregnancies. LARC and BTL were discussed but patient seems uninterested.

## 2013-11-29 NOTE — Progress Notes (Signed)
Pulse: 81

## 2013-11-30 LAB — PRESCRIPTION MONITORING PROFILE (19 PANEL)
AMPHETAMINE/METH: NEGATIVE ng/mL
BARBITURATE SCREEN, URINE: NEGATIVE ng/mL
BENZODIAZEPINE SCREEN, URINE: NEGATIVE ng/mL
BUPRENORPHINE, URINE: NEGATIVE ng/mL
CANNABINOID SCRN UR: NEGATIVE ng/mL
Carisoprodol, Urine: NEGATIVE ng/mL
Cocaine Metabolites: NEGATIVE ng/mL
Creatinine, Urine: 134.05 mg/dL (ref >20.0)
Fentanyl, Ur: NEGATIVE ng/mL
MDMA URINE: NEGATIVE ng/mL
METHAQUALONE SCREEN (URINE): NEGATIVE ng/mL
Meperidine, Ur: NEGATIVE ng/mL
Methadone Screen, Urine: NEGATIVE ng/mL
Nitrites, Initial: NEGATIVE ug/mL
OPIATE SCREEN, URINE: NEGATIVE ng/mL
Oxycodone Screen, Ur: NEGATIVE ng/mL
PROPOXYPHENE: NEGATIVE ng/mL
Phencyclidine, Ur: NEGATIVE ng/mL
TAPENTADOLUR: NEGATIVE ng/mL
Tramadol Scrn, Ur: NEGATIVE ng/mL
ZOLPIDEM, URINE: NEGATIVE ng/mL
pH, Initial: 6.7 pH (ref 4.5–8.9)

## 2013-12-06 ENCOUNTER — Ambulatory Visit (INDEPENDENT_AMBULATORY_CARE_PROVIDER_SITE_OTHER): Payer: Medicare Other | Admitting: Obstetrics and Gynecology

## 2013-12-06 ENCOUNTER — Ambulatory Visit (HOSPITAL_COMMUNITY)
Admission: RE | Admit: 2013-12-06 | Discharge: 2013-12-06 | Disposition: A | Discharge status: Home / Self Care | Payer: Medicare Other | Source: Ambulatory Visit | Attending: Obstetrics and Gynecology | Admitting: Obstetrics and Gynecology

## 2013-12-06 ENCOUNTER — Encounter: Payer: Self-pay | Admitting: Obstetrics and Gynecology

## 2013-12-06 VITALS — BP 126/73 | Temp 97.2°F | Wt 167.7 lb

## 2013-12-06 DIAGNOSIS — O99340 Other mental disorders complicating pregnancy, unspecified trimester: Secondary | ICD-10-CM

## 2013-12-06 DIAGNOSIS — O99320 Drug use complicating pregnancy, unspecified trimester: Secondary | ICD-10-CM

## 2013-12-06 DIAGNOSIS — F1910 Other psychoactive substance abuse, uncomplicated: Secondary | ICD-10-CM

## 2013-12-06 DIAGNOSIS — O0990 Supervision of high risk pregnancy, unspecified, unspecified trimester: Secondary | ICD-10-CM

## 2013-12-06 DIAGNOSIS — O09529 Supervision of elderly multigravida, unspecified trimester: Secondary | ICD-10-CM | POA: Insufficient documentation

## 2013-12-06 DIAGNOSIS — O99119 Other diseases of the blood and blood-forming organs and certain disorders involving the immune mechanism complicating pregnancy, unspecified trimester: Secondary | ICD-10-CM

## 2013-12-06 DIAGNOSIS — D689 Coagulation defect, unspecified: Secondary | ICD-10-CM | POA: Insufficient documentation

## 2013-12-06 DIAGNOSIS — O98119 Syphilis complicating pregnancy, unspecified trimester: Secondary | ICD-10-CM

## 2013-12-06 DIAGNOSIS — F1920 Other psychoactive substance dependence, uncomplicated: Secondary | ICD-10-CM

## 2013-12-06 DIAGNOSIS — O368190 Decreased fetal movements, unspecified trimester, not applicable or unspecified: Secondary | ICD-10-CM | POA: Insufficient documentation

## 2013-12-06 DIAGNOSIS — D696 Thrombocytopenia, unspecified: Secondary | ICD-10-CM | POA: Insufficient documentation

## 2013-12-06 LAB — POCT URINALYSIS DIP (DEVICE)
BILIRUBIN URINE: NEGATIVE
GLUCOSE, UA: 100 mg/dL — AB
HGB URINE DIPSTICK: NEGATIVE
Nitrite: NEGATIVE
Protein, ur: 30 mg/dL — AB
Specific Gravity, Urine: 1.025 (ref 1.005–1.030)
UROBILINOGEN UA: 1 mg/dL (ref 0.0–1.0)
pH: 6 (ref 5.0–8.0)

## 2013-12-06 NOTE — Progress Notes (Signed)
P=93  . Patient having hard time answering questions- slow to answer. States not feeling baby move, but also states she doesn't usually feel baby move, but per chart has been feelihg baby move.

## 2013-12-06 NOTE — Progress Notes (Signed)
BPP scheduled today at 145 pm. Patient's community health nurse will bring her back for the appointment.

## 2013-12-06 NOTE — Progress Notes (Signed)
Patient is doing well without complaints. Reports that she is uncertain whether she has felt the baby move or not. Discussed obtaining BPP as NST is full right now. Patient became a little agitated and argumentative. Discussed need to start postdate testing if undelivered at next visit with plans for IOL at 41 weeks.  FM/labor precautions reviewed

## 2013-12-12 ENCOUNTER — Encounter (HOSPITAL_COMMUNITY): Payer: Self-pay | Admitting: *Deleted

## 2013-12-12 ENCOUNTER — Inpatient Hospital Stay (HOSPITAL_COMMUNITY)
Admission: AD | Admit: 2013-12-12 | Discharge: 2013-12-13 | DRG: 775 | Disposition: A | Discharge status: Home / Self Care | Payer: Medicare Other | Source: Ambulatory Visit | Attending: Obstetrics & Gynecology | Admitting: Obstetrics & Gynecology

## 2013-12-12 DIAGNOSIS — O09529 Supervision of elderly multigravida, unspecified trimester: Principal | ICD-10-CM | POA: Diagnosis present

## 2013-12-12 DIAGNOSIS — F319 Bipolar disorder, unspecified: Secondary | ICD-10-CM | POA: Diagnosis present

## 2013-12-12 DIAGNOSIS — O99334 Smoking (tobacco) complicating childbirth: Secondary | ICD-10-CM | POA: Diagnosis present

## 2013-12-12 DIAGNOSIS — O480 Post-term pregnancy: Secondary | ICD-10-CM

## 2013-12-12 DIAGNOSIS — O99344 Other mental disorders complicating childbirth: Secondary | ICD-10-CM | POA: Diagnosis present

## 2013-12-12 DIAGNOSIS — F1410 Cocaine abuse, uncomplicated: Secondary | ICD-10-CM | POA: Diagnosis present

## 2013-12-12 DIAGNOSIS — F209 Schizophrenia, unspecified: Secondary | ICD-10-CM | POA: Diagnosis present

## 2013-12-12 DIAGNOSIS — IMO0001 Reserved for inherently not codable concepts without codable children: Secondary | ICD-10-CM

## 2013-12-12 HISTORY — DX: Other psychoactive substance abuse, uncomplicated: F19.10

## 2013-12-12 LAB — CBC
HCT: 34.1 % — ABNORMAL LOW (ref 36.0–46.0)
Hemoglobin: 11.8 g/dL — ABNORMAL LOW (ref 12.0–15.0)
MCH: 30.3 pg (ref 26.0–34.0)
MCHC: 34.6 g/dL (ref 30.0–36.0)
MCV: 87.4 fL (ref 78.0–100.0)
PLATELETS: 176 10*3/uL (ref 150–400)
RBC: 3.9 MIL/uL (ref 3.87–5.11)
RDW: 13.4 % (ref 11.5–15.5)
WBC: 6.1 10*3/uL (ref 4.0–10.5)

## 2013-12-12 LAB — RPR: RPR: REACTIVE — AB

## 2013-12-12 LAB — RPR TITER: RPR Titer: 1:2 {titer} — AB

## 2013-12-12 MED ORDER — LIDOCAINE HCL (PF) 1 % IJ SOLN
INTRAMUSCULAR | Status: AC
  Filled 2013-12-12: qty 30

## 2013-12-12 MED ORDER — WITCH HAZEL-GLYCERIN EX PADS: 1.0000 "application " | MEDICATED_PAD | CUTANEOUS | Status: DC | PRN

## 2013-12-12 MED ORDER — LACTATED RINGERS IV SOLN: 500.0000 mL | INTRAVENOUS | Status: DC | PRN

## 2013-12-12 MED ORDER — OXYTOCIN 40 UNITS IN LACTATED RINGERS INFUSION - SIMPLE MED: 62.5000 mL/h | INTRAVENOUS | Status: DC

## 2013-12-12 MED ORDER — ONDANSETRON HCL 4 MG PO TABS: 4.0000 mg | ORAL_TABLET | ORAL | Status: DC | PRN

## 2013-12-12 MED ORDER — TETANUS-DIPHTH-ACELL PERTUSSIS 5-2.5-18.5 LF-MCG/0.5 IM SUSP: 0.5000 mL | Freq: Once | INTRAMUSCULAR | Status: DC

## 2013-12-12 MED ORDER — IBUPROFEN 600 MG PO TABS
600.0000 mg | ORAL_TABLET | Freq: Four times a day (QID) | ORAL | Status: DC | PRN
Start: 2013-12-12 — End: 2013-12-13
  Administered 2013-12-12 (×2): 600 mg via ORAL
  Filled 2013-12-12: qty 1

## 2013-12-12 MED ORDER — LANOLIN HYDROUS EX OINT: TOPICAL_OINTMENT | CUTANEOUS | Status: DC | PRN

## 2013-12-12 MED ORDER — OXYTOCIN BOLUS FROM INFUSION
500.0000 mL | INTRAVENOUS | Status: DC
  Administered 2013-12-12: 500 mL via INTRAVENOUS

## 2013-12-12 MED ORDER — PRENATAL MULTIVITAMIN CH: 1.0000 | ORAL_TABLET | Freq: Every day | ORAL | Status: DC

## 2013-12-12 MED ORDER — LIDOCAINE HCL (PF) 1 % IJ SOLN
30.0000 mL | INTRAMUSCULAR | Status: DC | PRN
  Filled 2013-12-12: qty 30

## 2013-12-12 MED ORDER — POLYETHYLENE GLYCOL 3350 17 G PO PACK
17.0000 g | PACK | Freq: Every day | ORAL | Status: DC
  Administered 2013-12-12: 17 g via ORAL
  Filled 2013-12-12 (×3): qty 1

## 2013-12-12 MED ORDER — ACETAMINOPHEN 325 MG PO TABS: 650.0000 mg | ORAL_TABLET | ORAL | Status: DC | PRN

## 2013-12-12 MED ORDER — OXYTOCIN 40 UNITS IN LACTATED RINGERS INFUSION - SIMPLE MED
INTRAVENOUS | Status: AC
  Filled 2013-12-12: qty 1000

## 2013-12-12 MED ORDER — PRENATAL MULTIVITAMIN CH
1.0000 | ORAL_TABLET | Freq: Every day | ORAL | Status: DC
  Administered 2013-12-13: 1 via ORAL
  Filled 2013-12-12: qty 1

## 2013-12-12 MED ORDER — DIBUCAINE 1 % RE OINT: 1.0000 "application " | TOPICAL_OINTMENT | RECTAL | Status: DC | PRN

## 2013-12-12 MED ORDER — ONDANSETRON HCL 4 MG/2ML IJ SOLN: 4.0000 mg | INTRAMUSCULAR | Status: DC | PRN

## 2013-12-12 MED ORDER — BENZOCAINE-MENTHOL 20-0.5 % EX AERO: 1.0000 "application " | INHALATION_SPRAY | CUTANEOUS | Status: DC | PRN

## 2013-12-12 MED ORDER — CITRIC ACID-SODIUM CITRATE 334-500 MG/5ML PO SOLN: 30.0000 mL | ORAL | Status: DC | PRN

## 2013-12-12 MED ORDER — SIMETHICONE 80 MG PO CHEW: 80.0000 mg | CHEWABLE_TABLET | ORAL | Status: DC | PRN

## 2013-12-12 MED ORDER — DIPHENHYDRAMINE HCL 25 MG PO CAPS: 25.0000 mg | ORAL_CAPSULE | Freq: Four times a day (QID) | ORAL | Status: DC | PRN

## 2013-12-12 MED ORDER — OXYCODONE-ACETAMINOPHEN 5-325 MG PO TABS: 1.0000 | ORAL_TABLET | ORAL | Status: DC | PRN

## 2013-12-12 MED ORDER — ONDANSETRON HCL 4 MG/2ML IJ SOLN: 4.0000 mg | Freq: Four times a day (QID) | INTRAMUSCULAR | Status: DC | PRN

## 2013-12-12 MED ORDER — ZOLPIDEM TARTRATE 5 MG PO TABS: 5.0000 mg | ORAL_TABLET | Freq: Every evening | ORAL | Status: DC | PRN

## 2013-12-12 MED ORDER — LACTATED RINGERS IV SOLN: INTRAVENOUS | Status: DC

## 2013-12-12 MED ORDER — IBUPROFEN 600 MG PO TABS
600.0000 mg | ORAL_TABLET | Freq: Four times a day (QID) | ORAL | Status: DC
  Administered 2013-12-13 (×2): 600 mg via ORAL
  Filled 2013-12-12 (×3): qty 1

## 2013-12-12 MED ORDER — LACTATED RINGERS IV SOLN
INTRAVENOUS | Status: DC
  Administered 2013-12-12: 12:00:00 via INTRAVENOUS

## 2013-12-12 MED ORDER — SENNOSIDES-DOCUSATE SODIUM 8.6-50 MG PO TABS
2.0000 | ORAL_TABLET | ORAL | Status: DC
  Administered 2013-12-13: 2 via ORAL
  Filled 2013-12-12: qty 2

## 2013-12-12 MED ORDER — RISPERIDONE 1 MG PO TABS
1.0000 mg | ORAL_TABLET | Freq: Every day | ORAL | Status: DC
  Administered 2013-12-12: 1 mg via ORAL
  Filled 2013-12-12 (×2): qty 1

## 2013-12-12 NOTE — Progress Notes (Signed)
UR completed 

## 2013-12-12 NOTE — H&P (Signed)
Cassidy Thomas is a 38 y.o. female G2P1001 at 2371w1d by US here for active labor. Maternal Medical History:  Reason for admission: Contractions.   Contractions: Onset was 6-12 hours ago.   Frequency: regular.   Duration is approximately 5 minutes.   Perceived severity is strong.    Fetal activity: Perceived fetal activity is normal.   Last perceived fetal movement was within the past 12 hours.    Prenatal complications: No bleeding.   Hx of substance abuse; none reported or discovered this pregnancy   Prenatal Complications - Diabetes: none.    OB History   Grav Para Term Preterm Abortions TAB SAB Ect Mult Living   2 1 1  0 0 0 0 0 0 1     Past Medical History  Diagnosis Date  . Bipolar 1 disorder   . Schizophrenia   . Asthma   . Substance abuse    Past Surgical History  Procedure Laterality Date  . Breast lumpectomy  2012    right breast   Family History: family history includes Cancer in her maternal grandmother. Social History:  reports that she has been smoking.  She has never used smokeless tobacco. She reports that she uses illicit drugs (Cocaine and "Crack" cocaine). She reports that she does not drink alcohol.   Prenatal Transfer Tool  Maternal Diabetes: No Genetic Screening: Normal Maternal Ultrasounds/Referrals: Normal Fetal Ultrasounds or other Referrals:  None Maternal Substance Abuse:  Yes:  Type: Cocaine  (remote; none reported this pregnancy) Significant Maternal Medications:  Meds include: Other:  Significant Maternal Lab Results:  None Other Comments:  Risperdal   Review of Systems  Constitutional: Negative for fever and chills.  Eyes: Negative for blurred vision.  Respiratory: Negative for shortness of breath.   Gastrointestinal: Negative for abdominal pain.  Neurological: Negative for headaches.   Dilation: 5.5 Effacement (%): 70 Station: -2 Exam by:: S. Carrera, RNC Blood pressure 139/76, pulse 73, temperature 97.5 F (36.4 C),  temperature source Oral, resp. rate 18, last menstrual period 04/24/2013. Maternal Exam:  Uterine Assessment: Contraction strength is moderate.  Contraction duration is 5 minutes. Contraction frequency is regular.   Abdomen: Patient reports no abdominal tenderness.   Fetal Exam Fetal Monitor Review: Mode: ultrasound.   Baseline rate: 140.  Variability: moderate (6-25 bpm).   Pattern: accelerations present and no decelerations.    Fetal State Assessment: Category I - tracings are normal.     Physical Exam  Constitutional: She is oriented to person, place, and time. She appears well-developed and well-nourished.  Cardiovascular: Normal rate and regular rhythm.   No murmur heard. Respiratory: Effort normal and breath sounds normal. No respiratory distress.  GI:  Gravid, no tenderness   Neurological: She is alert and oriented to person, place, and time. She displays normal reflexes.    Prenatal labs: ABO, Rh: AB/POS/-- (08/07 1143) Antibody: NEG (08/07 1143) Rubella: 1.92 (08/07 1143) RPR: REACTIVE (12/08 1131)  HBsAg: NEGATIVE (08/07 1143)  HIV: NON REACTIVE (12/08 1131)  GBS: Negative (01/22 0000)  Gluc: wnl  Assessment/Plan: Cassidy Thomas is a 38 y.o. female G2P1001 with IUP at 671w1d presenting for active labor. Pt states she has been having regular, every 5 minutes contractions, associated with none vaginal bleeding.  Membranes are intact, with active fetal movement.  [facility]  #Labor: progressing normally - expectant management #Pain: Pt verbalized desire for nature birth w/o pain medications. Will continue to follow & reassess desires for pain management #FWB:  Cat 1 tracing #ID:  GBS (- ) #MOF: NA - SW consulted for possible adaption/placement of baby #MOC: Will discuss postpartum  Wenda Low 12/12/2013, 11:38 AM  Seen by me also Patient has mental disability but understands simple explanations Wants to avoid analgesia for now Plan  delivery by SVD then SW consult for CPS plans Aviva Signs, CNM

## 2013-12-12 NOTE — MAU Note (Addendum)
Arrived via EMS. States she is having contractions since 0500. Patient is mentally impaired.

## 2013-12-13 ENCOUNTER — Encounter: Payer: Medicare Other | Admitting: Family Medicine

## 2013-12-13 LAB — CBC
HEMATOCRIT: 32.5 % — AB (ref 36.0–46.0)
HEMOGLOBIN: 11.3 g/dL — AB (ref 12.0–15.0)
MCH: 30.6 pg (ref 26.0–34.0)
MCHC: 34.8 g/dL (ref 30.0–36.0)
MCV: 88.1 fL (ref 78.0–100.0)
Platelets: 170 10*3/uL (ref 150–400)
RBC: 3.69 MIL/uL — ABNORMAL LOW (ref 3.87–5.11)
RDW: 13.5 % (ref 11.5–15.5)
WBC: 9.5 10*3/uL (ref 4.0–10.5)

## 2013-12-13 LAB — T.PALLIDUM AB, IGG: T pallidum Antibodies (TP-PA): >8 S/CO — ABNORMAL HIGH (ref <0.90)

## 2013-12-13 MED ORDER — IBUPROFEN 600 MG PO TABS: 600.0000 mg | ORAL_TABLET | Freq: Four times a day (QID) | ORAL | Status: DC

## 2013-12-13 NOTE — Clinical Social Work Maternal (Addendum)
Clinical Social Work Department  PSYCHOSOCIAL ASSESSMENT - MATERNAL/CHILD  12/13/2013  Patient: Cassidy Thomas,Cassidy Thomas Account Number: [id number] Admit Date: 12/12/2013  Childs Name:  No name   Clinical Social Worker: Jasneet Schobert, LCSW Date/Time: 12/13/2013 12:17 PM  Date Referred: 12/13/2013  Referral source   CN    Referred reason   Behavioral Health Issues   Competency/Guardianship   Other referral source:  I: FAMILY / HOME ENVIRONMENT  Child's legal guardian: PARENT  Guardian - Name  Guardian - Age  Guardian - Address   Cassidy Thomas  38  [address] [place], [place] [address]   (Unknown)     Other household support members/support persons  Name  Relationship  DOB   Joann Woods  OTHER  [facility] staff   Other support:  Johnny Dambrosio- pts grandfather   II PSYCHOSOCIAL DATA  Information Source: Patient Interview  Financial and Community Resources  Employment:  Financial resources: Medicaid  If Medicaid - County: [place]  School / Grade:  Maternity Care Coordinator / Child Services Coordination / Early Interventions: Cultural issues impacting care:  III STRENGTHS  Strength comment:  IV RISK FACTORS AND CURRENT PROBLEMS  Current Problem: YES  Risk Factor & Current Problem  Patient Issue  Family Issue  Risk Factor / Current Problem Comment   Knowledge/Cognitive Deficit  Y  N    DSS Involvement  Y  N    Mental Illness  Y  N  Schizophrenia   Substance Abuse  Y  N  Hx of crack cocaine use   V SOCIAL WORK ASSESSMENT  CSW attempted to meet with pt to assess her current level of functioning & discuss discharged plans for the infant. Pt was accompanied by Joann Woods, staff person at [facility] where the pt lives. CSW attempted to engaged pt in meaningful conversation however pt seemed to struggle with answering this writers questions. Pt was not able to answer this writers questions about discharge plans for the infant or simple questions asked to her. Joann  spoke up & said "the baby can not go to the group home with her." CSW inquired about pts guardian however Joann said that pt does not have a legal guardian appointed. Joann provided CSW with contact information for Jadeka, pts ACT, RN [phone] or [phone]) in the community. Jadeka plans to come meet with the pt today & discuss pts situation further. CSW contacted Child Protective Services & case was assigned to Donna Borawski. Donna came to meet with pt today & plans to file a petition for custody. While pt verbalizes an understanding about the baby going into foster care, this CSW is concerned about pts inability to lack of competence. To this writers knowledge pt does not have a guardian. Jadeka plans to continue working with the pt in the community & assist with guardianship process.  Her mental illnesses are being treated with Risperdal. Pts substance abuse history noted. UDS collection pending, as well as meconium results. CSW will continue to follow & facilitate appropriate discharge of MOB & infant.  Copy of safety assessment completed by CPS worker, placed in infants paper chart.  VI SOCIAL WORK PLAN  Social Work Plan   Child Protective Services Report   No Further Intervention Required / No Barriers to Discharge   Type of pt/family education:  If child protective services report - county:  If child protective services report - date:  Information/referral to community resources comment:  Other social work plan:       protective services report - date:   Information/referral to community resources comment:   Other social work plan:

## 2013-12-13 NOTE — Discharge Summary (Signed)
Obstetric Discharge Summary Reason for Admission: onset of labor Prenatal Procedures: none Intrapartum Procedures: spontaneous vaginal delivery Postpartum Procedures: none Complications-Operative and Postpartum: none Hemoglobin  Date Value Ref Range Status  12/13/2013 11.3* 12.0 - 15.0 g/dL Final     HCT  Date Value Ref Range Status  12/13/2013 32.5* 36.0 - 46.0 % Final   Delivery Note: At 1:16 PM a viable female was delivered via Vaginal, Spontaneous Delivery (Presentation: ; Occiput Anterior). APGAR: 9, 9; weight 5 lb 12.8 oz (2630 g).  Placenta status: Intact, Spontaneous. Cord: 3 vessels with the following complications: None. Cord pH:  Anesthesia: None  Episiotomy: None  Lacerations: None  Suture Repair:  Est. Blood Loss (mL): 300  Mom to postpartum. Baby to Couplet care / Skin to Skin.  ARNOLD,JAMES  12/12/2013, 2:05 PM    Physical Exam:  General: alert, cooperative and no distress Lochia: appropriate Uterine Fundus: firm Incision: N/A DVT Evaluation: No evidence of DVT seen on physical exam. Negative Homan's sign. No significant calf/ankle edema.   Hospital Course: Driscilla Grammeslberta J Clair is a 38 y.o. [phone] who presented for onset of labor at 2080w1d. She progressed well and delivered a viable female via NSVD. Postpartum care was uncomplicated and she is being discharged in stable condition. She is currently bottle feeding and has no plans for contraception at this time (risks and potential complications discussed with patient). No plans for circumcision. SW will see pt today as there may be a plan in place to evaluate pt's competency. It is not anticipated that the infant will be discharged home in the care of the pt.   Discharge Diagnoses: Term Pregnancy-delivered  Discharge Information: Date: 12/13/2013 Activity: pelvic rest Diet: routine Medications: PNV, Ibuprofen and Iron Condition: stable Instructions: refer to practice specific booklet Discharge to: home  Follow-up  Information   Follow up with [facility]. Schedule an appointment as soon as possible for a visit in 4 weeks. (Postpartum follow up. )       Newborn Data: Live born female  Birth Weight: 5 lb 12.8 oz (2630 g) APGAR: 9, 9    [place]an, Bennie-John, T PA-S 12/13/2013, 8:33 AM  I have seen and examined this patient and I agree with the above. Cam HaiSHAW, KIMBERLY 8:43 AM 12/13/2013

## 2013-12-13 NOTE — Discharge Instructions (Signed)

## 2013-12-17 ENCOUNTER — Ambulatory Visit (INDEPENDENT_AMBULATORY_CARE_PROVIDER_SITE_OTHER): Payer: Medicare Other

## 2013-12-17 VITALS — BP 121/79 | HR 84 | Wt 158.7 lb

## 2013-12-17 DIAGNOSIS — Z3049 Encounter for surveillance of other contraceptives: Secondary | ICD-10-CM

## 2013-12-17 DIAGNOSIS — Z309 Encounter for contraceptive management, unspecified: Secondary | ICD-10-CM

## 2013-12-17 LAB — POCT URINALYSIS DIP (DEVICE)
Bilirubin Urine: NEGATIVE
GLUCOSE, UA: NEGATIVE mg/dL
Ketones, ur: NEGATIVE mg/dL
Nitrite: NEGATIVE
Protein, ur: NEGATIVE mg/dL
SPECIFIC GRAVITY, URINE: 1.02 (ref 1.005–1.030)
UROBILINOGEN UA: 4 mg/dL — AB (ref 0.0–1.0)
pH: 6.5 (ref 5.0–8.0)

## 2013-12-17 MED ORDER — MEDROXYPROGESTERONE ACETATE 104 MG/0.65ML ~~LOC~~ SUSP
104.0000 mg | Freq: Once | SUBCUTANEOUS | Status: AC
  Administered 2013-12-17: 104 mg via SUBCUTANEOUS

## 2013-12-17 NOTE — Progress Notes (Signed)
Pt. Here today for depo-provera injection for contraception management. Injection given. Pt. Tolerated well. To follow up in 4 weeks for PP visit and in may for second injection.

## 2014-01-11 ENCOUNTER — Telehealth: Payer: Self-pay | Admitting: *Deleted

## 2014-01-11 ENCOUNTER — Ambulatory Visit: Payer: Medicare Other | Admitting: Family Medicine

## 2014-01-11 NOTE — Telephone Encounter (Signed)
Called patient and informed her that she missed her appt today. She stated that she didn't know what it was for so she didn't come. She asked that it be rescheduled. I told her that I would have front desk reschedule it and call her with appt. Pt agrees with this.

## 2014-01-14 ENCOUNTER — Emergency Department (HOSPITAL_COMMUNITY)
Admission: EM | Admit: 2014-01-14 | Discharge: 2014-01-15 | Disposition: A | Discharge status: Home / Self Care | Payer: Medicare Other | Attending: Emergency Medicine | Admitting: Emergency Medicine

## 2014-01-14 ENCOUNTER — Encounter (HOSPITAL_COMMUNITY): Payer: Self-pay | Admitting: Emergency Medicine

## 2014-01-14 DIAGNOSIS — F259 Schizoaffective disorder, unspecified: Secondary | ICD-10-CM | POA: Insufficient documentation

## 2014-01-14 DIAGNOSIS — F319 Bipolar disorder, unspecified: Secondary | ICD-10-CM | POA: Insufficient documentation

## 2014-01-14 DIAGNOSIS — Z3202 Encounter for pregnancy test, result negative: Secondary | ICD-10-CM | POA: Insufficient documentation

## 2014-01-14 DIAGNOSIS — F911 Conduct disorder, childhood-onset type: Secondary | ICD-10-CM | POA: Insufficient documentation

## 2014-01-14 DIAGNOSIS — Z79899 Other long term (current) drug therapy: Secondary | ICD-10-CM | POA: Insufficient documentation

## 2014-01-14 DIAGNOSIS — J45909 Unspecified asthma, uncomplicated: Secondary | ICD-10-CM | POA: Insufficient documentation

## 2014-01-14 DIAGNOSIS — F1910 Other psychoactive substance abuse, uncomplicated: Secondary | ICD-10-CM

## 2014-01-14 DIAGNOSIS — Z9119 Patient's noncompliance with other medical treatment and regimen: Secondary | ICD-10-CM | POA: Insufficient documentation

## 2014-01-14 DIAGNOSIS — F1410 Cocaine abuse, uncomplicated: Secondary | ICD-10-CM | POA: Insufficient documentation

## 2014-01-14 DIAGNOSIS — R45850 Homicidal ideations: Secondary | ICD-10-CM | POA: Insufficient documentation

## 2014-01-14 DIAGNOSIS — F17200 Nicotine dependence, unspecified, uncomplicated: Secondary | ICD-10-CM | POA: Insufficient documentation

## 2014-01-14 DIAGNOSIS — Z91199 Patient's noncompliance with other medical treatment and regimen due to unspecified reason: Secondary | ICD-10-CM | POA: Insufficient documentation

## 2014-01-14 DIAGNOSIS — Z791 Long term (current) use of non-steroidal anti-inflammatories (NSAID): Secondary | ICD-10-CM | POA: Insufficient documentation

## 2014-01-14 DIAGNOSIS — IMO0002 Reserved for concepts with insufficient information to code with codable children: Secondary | ICD-10-CM | POA: Insufficient documentation

## 2014-01-14 DIAGNOSIS — F79 Unspecified intellectual disabilities: Secondary | ICD-10-CM | POA: Diagnosis present

## 2014-01-14 LAB — RAPID URINE DRUG SCREEN, HOSP PERFORMED
AMPHETAMINES: NOT DETECTED
BARBITURATES: NOT DETECTED
BENZODIAZEPINES: NOT DETECTED
Cocaine: POSITIVE — AB
Opiates: NOT DETECTED
Tetrahydrocannabinol: NOT DETECTED

## 2014-01-14 LAB — COMPREHENSIVE METABOLIC PANEL
ALBUMIN: 3.5 g/dL (ref 3.5–5.2)
ALK PHOS: 123 U/L — AB (ref 39–117)
ALT: 12 U/L (ref 0–35)
AST: 24 U/L (ref 0–37)
BUN: 7 mg/dL (ref 6–23)
CHLORIDE: 105 meq/L (ref 96–112)
CO2: 25 mEq/L (ref 19–32)
Calcium: 9.6 mg/dL (ref 8.4–10.5)
Creatinine, Ser: 0.93 mg/dL (ref 0.50–1.10)
GFR calc Af Amer: >90 mL/min (ref >90)
GFR, EST NON AFRICAN AMERICAN: 78 mL/min — AB (ref >90)
Glucose, Bld: 89 mg/dL (ref 70–99)
POTASSIUM: 4 meq/L (ref 3.7–5.3)
Sodium: 140 mEq/L (ref 137–147)
Total Bilirubin: 0.3 mg/dL (ref 0.3–1.2)
Total Protein: 7.2 g/dL (ref 6.0–8.3)

## 2014-01-14 LAB — ETHANOL: Alcohol, Ethyl (B): <11 mg/dL (ref 0–11)

## 2014-01-14 LAB — CBC
HEMATOCRIT: 40.7 % (ref 36.0–46.0)
Hemoglobin: 14.1 g/dL (ref 12.0–15.0)
MCH: 31 pg (ref 26.0–34.0)
MCHC: 34.6 g/dL (ref 30.0–36.0)
MCV: 89.5 fL (ref 78.0–100.0)
Platelets: 185 10*3/uL (ref 150–400)
RBC: 4.55 MIL/uL (ref 3.87–5.11)
RDW: 12.1 % (ref 11.5–15.5)
WBC: 5.8 10*3/uL (ref 4.0–10.5)

## 2014-01-14 LAB — ACETAMINOPHEN LEVEL

## 2014-01-14 LAB — SALICYLATE LEVEL: Salicylate Lvl: <2 mg/dL — ABNORMAL LOW (ref 2.8–20.0)

## 2014-01-14 LAB — POC URINE PREG, ED: PREG TEST UR: NEGATIVE

## 2014-01-14 MED ORDER — POLYETHYLENE GLYCOL 3350 17 G PO PACK
17.0000 g | PACK | Freq: Every day | ORAL | Status: DC | PRN
  Filled 2014-01-14: qty 1

## 2014-01-14 MED ORDER — IBUPROFEN 200 MG PO TABS: 600.0000 mg | ORAL_TABLET | Freq: Three times a day (TID) | ORAL | Status: DC | PRN

## 2014-01-14 MED ORDER — NICOTINE 21 MG/24HR TD PT24
21.0000 mg | MEDICATED_PATCH | Freq: Every day | TRANSDERMAL | Status: DC
  Administered 2014-01-14 – 2014-01-15 (×2): 21 mg via TRANSDERMAL
  Filled 2014-01-14 (×2): qty 1

## 2014-01-14 MED ORDER — IBUPROFEN 200 MG PO TABS
600.0000 mg | ORAL_TABLET | Freq: Four times a day (QID) | ORAL | Status: DC
  Administered 2014-01-14 – 2014-01-15 (×2): 600 mg via ORAL
  Filled 2014-01-14 (×2): qty 3

## 2014-01-14 MED ORDER — LORAZEPAM 1 MG PO TABS: 1.0000 mg | ORAL_TABLET | Freq: Three times a day (TID) | ORAL | Status: DC | PRN

## 2014-01-14 MED ORDER — ONDANSETRON HCL 4 MG PO TABS: 4.0000 mg | ORAL_TABLET | Freq: Three times a day (TID) | ORAL | Status: DC | PRN

## 2014-01-14 MED ORDER — RISPERIDONE 1 MG PO TABS
1.0000 mg | ORAL_TABLET | Freq: Two times a day (BID) | ORAL | Status: DC
  Administered 2014-01-14 – 2014-01-15 (×2): 1 mg via ORAL
  Filled 2014-01-14 (×2): qty 1

## 2014-01-14 MED ORDER — ALUM & MAG HYDROXIDE-SIMETH 200-200-20 MG/5ML PO SUSP: 30.0000 mL | ORAL | Status: DC | PRN

## 2014-01-14 MED ORDER — ACETAMINOPHEN 325 MG PO TABS: 650.0000 mg | ORAL_TABLET | ORAL | Status: DC | PRN

## 2014-01-14 MED ORDER — ZOLPIDEM TARTRATE 5 MG PO TABS: 5.0000 mg | ORAL_TABLET | Freq: Every evening | ORAL | Status: DC | PRN
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
## 2014-01-14 NOTE — ED Notes (Signed)
Pt resting in bed watching tv. Pt repeatedly asking this RN "Why am I here? Can I go home now?" Pt unable to be redirected and is tangential. No distress noted at this time.

## 2014-01-14 NOTE — ED Notes (Signed)
Pt and her belongings have been wanded

## 2014-01-14 NOTE — ED Notes (Signed)
Joyce GrossKay [phone] : Call to notify if Pt is being DS or admitted
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
## 2014-01-14 NOTE — Progress Notes (Signed)
Attempted to secure placement at the following facilities:   [facility]- Nancy- no beds [facility]- [place]- can fax for AM d/c [place]- left message [facility]- Dee- no female beds but can fax for AM d/c [facility]- Neil- no beds [facility]- [place]- fax [facility]- kelly- no beds [facility]- Rose- fax [facility]- Kelly- will review screenings [facility]- Debbie- no beds   Joia Doyle, MSW, LCSWA, 01/14/2014 Evening Clinical Social Worker [phone] 
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
## 2014-01-15 ENCOUNTER — Encounter (HOSPITAL_COMMUNITY): Payer: Self-pay | Admitting: Registered Nurse

## 2014-01-15 MED ORDER — IBUPROFEN 200 MG PO TABS: 600.0000 mg | ORAL_TABLET | Freq: Three times a day (TID) | ORAL | Status: DC

## 2014-01-15 NOTE — ED Notes (Signed)
[facility] will call back.
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
## 2014-01-15 NOTE — Consult Note (Signed)
Face to face evaluation and I agree with this note 

## 2014-01-15 NOTE — ED Notes (Signed)
sheriff transport here to pick up pt

## 2014-01-15 NOTE — BH Assessment (Signed)
Patient has been accepted to [facility] by Dr. Maia BreslowKomisorava pending completion of her IVC. The nursing report # is [id number]. IVC to be completed by Dr. Ladona Ridgelaylor.

## 2014-01-19 NOTE — ED Provider Notes (Signed)
Medical screening examination/treatment/procedure(s) were performed by non-physician practitioner and as supervising physician I was immediately available for consultation/collaboration.   EKG Interpretation None        Peola Joynt, MD 01/19/14 1101 

## 2014-01-24 ENCOUNTER — Encounter: Payer: Self-pay | Admitting: Family Medicine

## 2014-01-24 ENCOUNTER — Ambulatory Visit (INDEPENDENT_AMBULATORY_CARE_PROVIDER_SITE_OTHER): Payer: Medicare Other | Admitting: Family Medicine

## 2014-01-24 NOTE — Progress Notes (Signed)
  Subjective:     Cassidy Thomas is a 38 y.o. female who presents for a postpartum visit. She is 6 weeks postpartum following a spontaneous vaginal delivery. I have fully reviewed the prenatal and intrapartum course. The delivery was at 40 gestational weeks. Outcome: spontaneous vaginal delivery. Anesthesia: none. Postpartum course has been remarkable [facility]. Baby's course has been unknown--baby is not in her care.  Bleeding no bleeding. Bowel function is normal. Bladder function is normal. Patient is sexually active. Contraception method is none. Postpartum depression screening: unable to discernible secondary to impaired thinking.  The following portions of the patient's history were reviewed and updated as appropriate: allergies, current medications, past family history, past medical history, past social history, past surgical history and problem list.  Review of Systems A comprehensive review of systems was negative.   Objective:    BP 122/82  Pulse 102  Temp(Src) 98.2 F (36.8 C) (Oral)  Ht 5\' 6"  (1.676 m)  Wt 155 lb 11.2 oz (70.625 kg)  BMI 25.14 kg/m2  LMP 01/22/2014  Breastfeeding? No  General:  alert, cooperative and appears stated age        Assessment:     6 wk postpartum exam. Pap smear not done at today's visit.   Plan:    1. Contraception: Depo-Provera injections 2. Continue psych 3. Follow up in: 6 weeks for Depo or as needed.

## 2014-01-24 NOTE — Patient Instructions (Signed)

## 2014-03-11 ENCOUNTER — Ambulatory Visit: Payer: Medicare Other

## 2014-08-26 ENCOUNTER — Encounter: Payer: Self-pay | Admitting: Family Medicine

## 2014-11-18 IMAGING — US US OB COMP LESS 14 WK
1 series · 13 of 28 positions shown · non-contrast
Comparison: none

[Series 1: us ob comp less 14 wks · 13 of 35 slices shown]
[im 2/35]
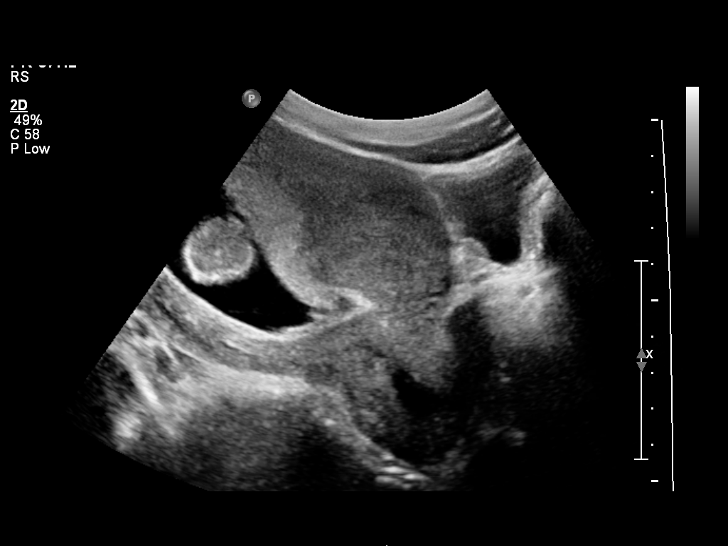
[im 4/35]
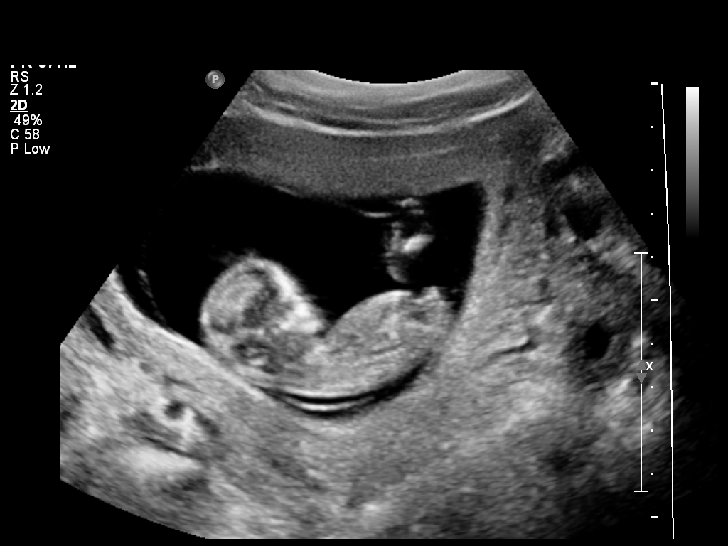
[im 7/35]
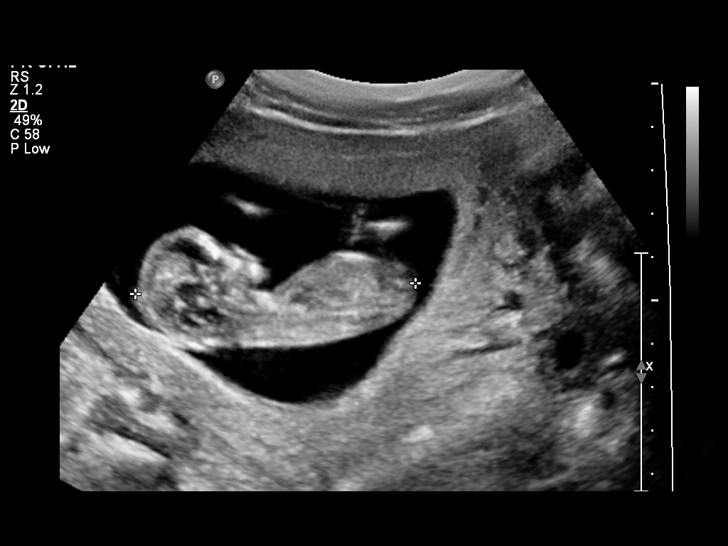
[im 9/35]
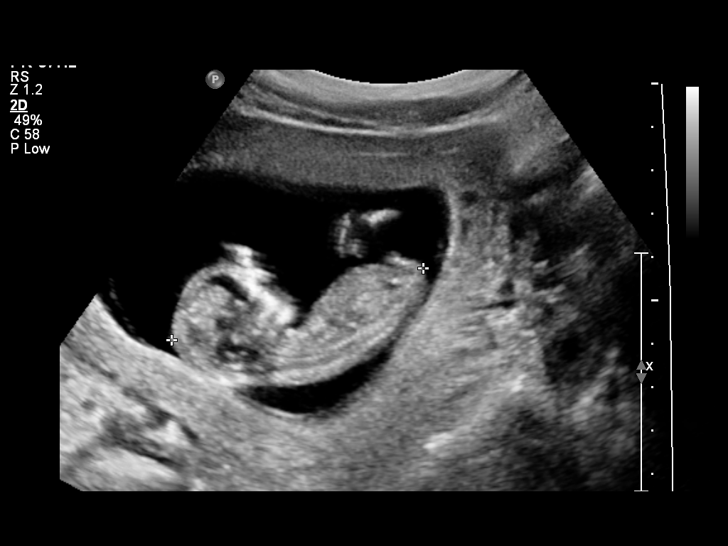
[im 12/35]
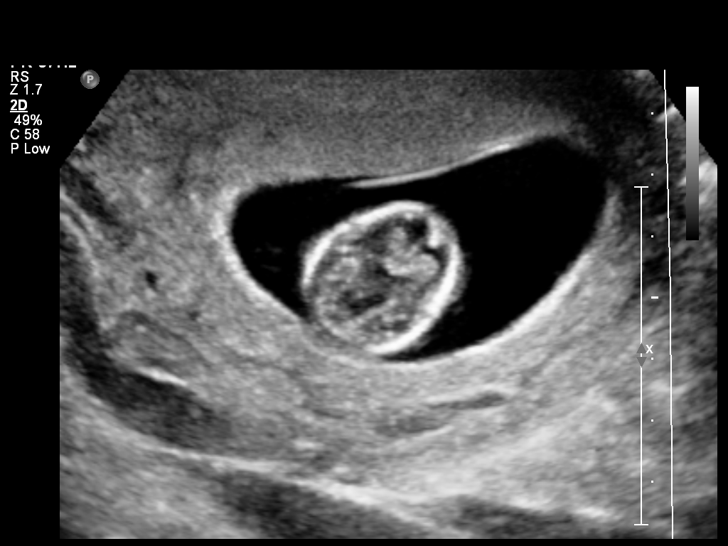
[im 14/35]
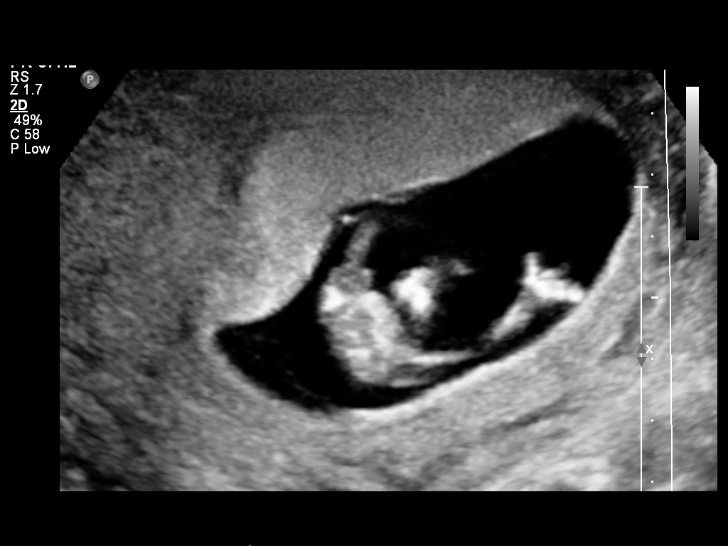
[im 18/35]
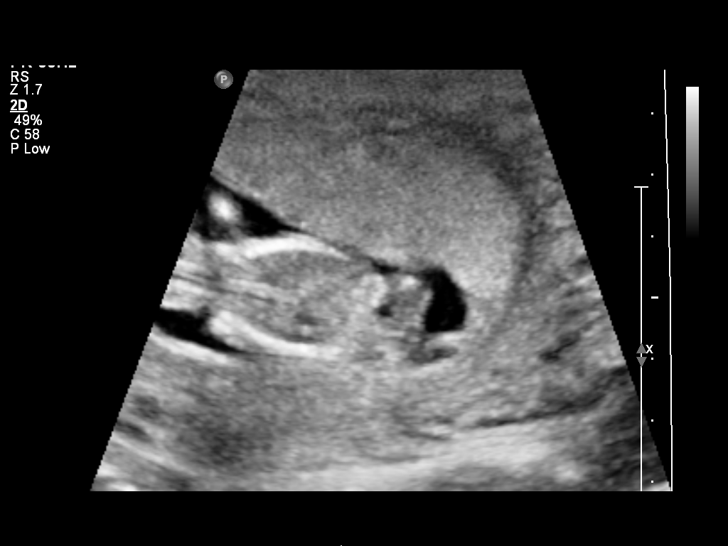
[im 21/35]
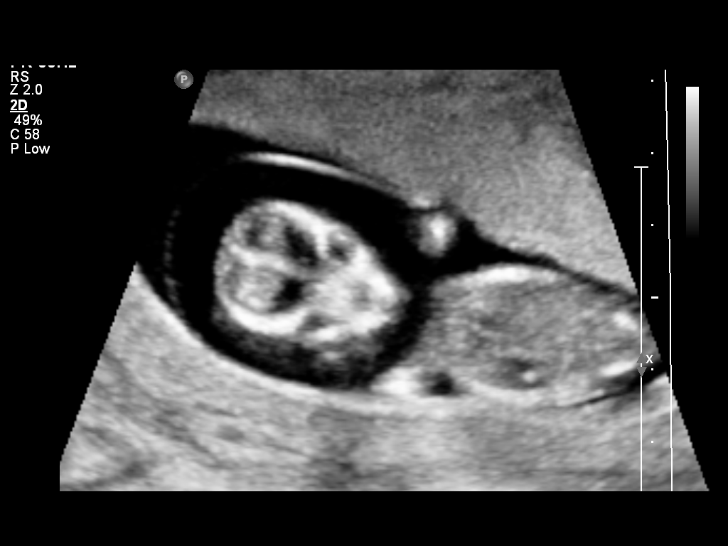
[im 23/35]
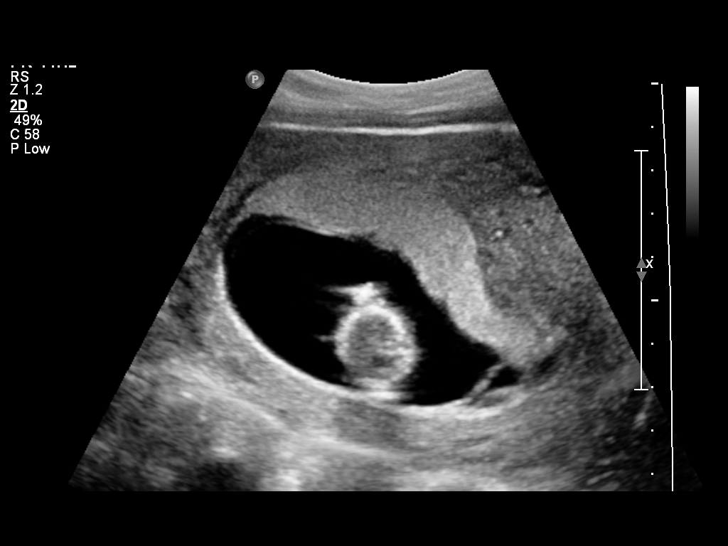
[im 26/35]
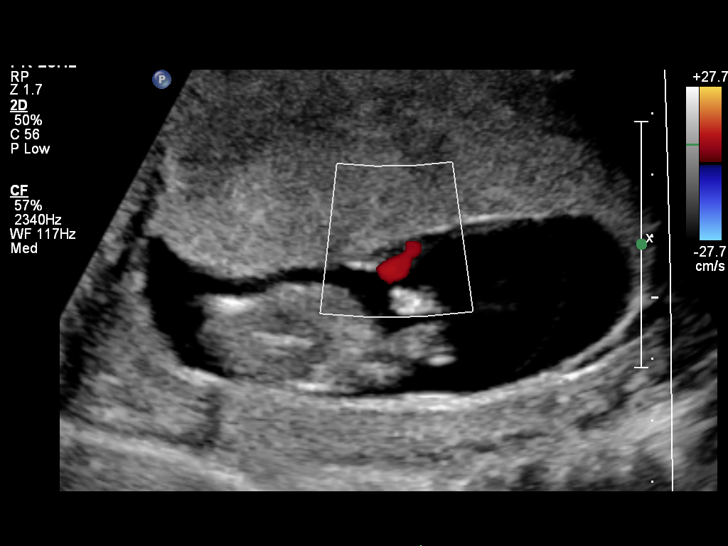
[im 28/35]
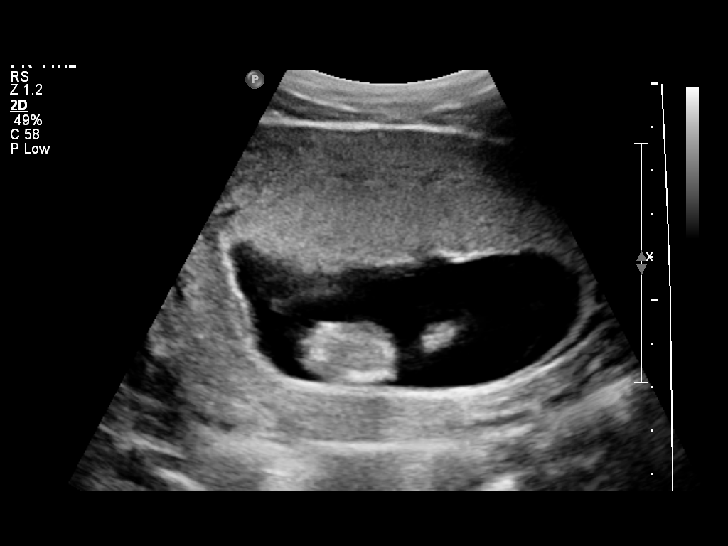
[im 31/35]
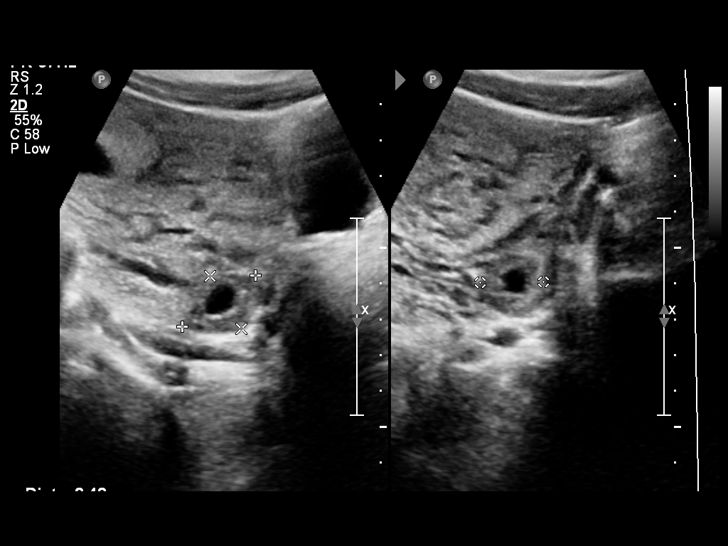
[im 33/35]
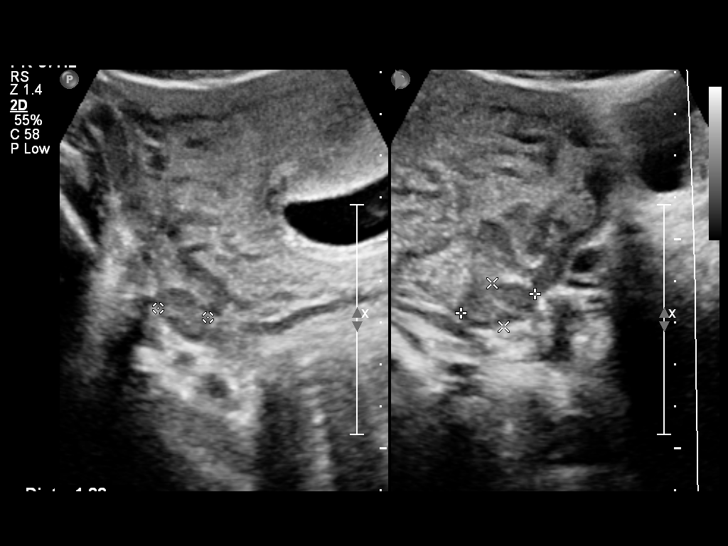

[13 of 28 positions shown; findings below may reference images not displayed]

OBSTETRICS REPORT
                      (Signed Final 05/31/2013 [DATE])

Service(s) Provided

 US OB COMP LESS 14 WKS                                76801.0
Indications

 Unsure of LMP;  Establish Gestational [AGE]
Fetal Evaluation

 Num Of Fetuses:    1
 Preg. Location:    Intrauterine
 Gest. Sac:         Intrauterine
 Fetal Pole:        Visualized
 Fetal Heart Rate:  161                         bpm
 Cardiac Activity:  Observed
 Presentation:      Variable
 Placenta:          Anterior, above cervical os
 P. Cord            Visualized, central
 Insertion:

 Amniotic Fluid
 AFI FV:      Subjectively within normal limits
Biometry

 CRL:     59.8  mm    G. Age:   12w 2d                 EDD:   12/11/13
Gestational Age

 Best:          12w 2d    Det. By:   U/S C R L (05/31/13)     EDD:   12/11/13
Anatomy

 Cranium:          Appears normal         Abdominal Wall:   Appears nml (cord
                                                            insert, abd wall)
 Choroid Plexus:   Appears normal         Bladder:          Appears normal
 Stomach:          Appears normal, left   Lower             Visualized
                   sided                  Extremities:
 Abdomen:          Appears normal         Upper             Visualized
                                          Extremities:
Cervix Uterus Adnexa

 Cervix:       Normal appearance by transabdominal scan.
 Uterus:       No abnormality visualized.
 Cul De Sac:   No free fluid seen.
 Left Ovary:   Within normal limits.
 Right Ovary:  Within normal limits.
 Adnexa:     No abnormality visualized.
Impression

 Single living IUP with US Gest. Age of 12w 2d, and EDD of
 12/11/2013.
Recommendations

 US for fetal anatomic evaluation at 18-20 wks GA.

 questions or concerns.

## 2014-12-20 ENCOUNTER — Encounter: Payer: Self-pay | Admitting: General Practice

## 2015-02-10 IMAGING — US US OB FOLLOW-UP
2 series · 12 of 28 positions shown · non-contrast
Comparison: none

[Series 1: us ob follow up · 1 of 3 slices shown (1 of 2)]
[im 3/3]
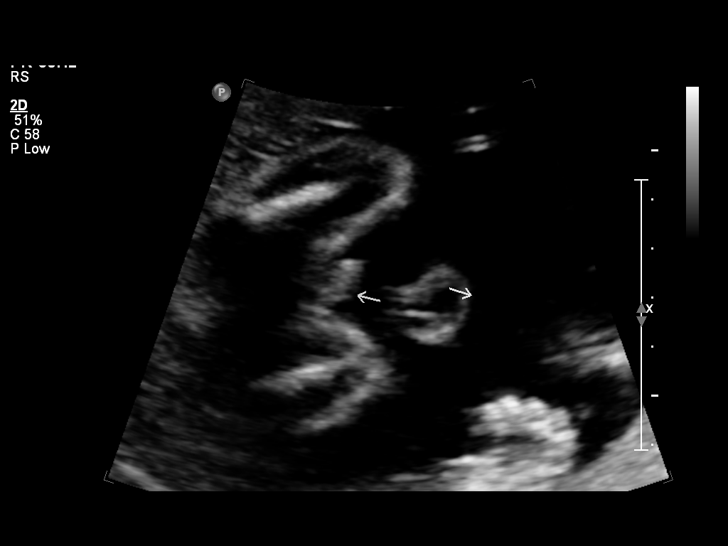

[Series 1: us ob follow up · 35 acquisitions, 11 frames shown (2 of 2)]
[im 2/35]
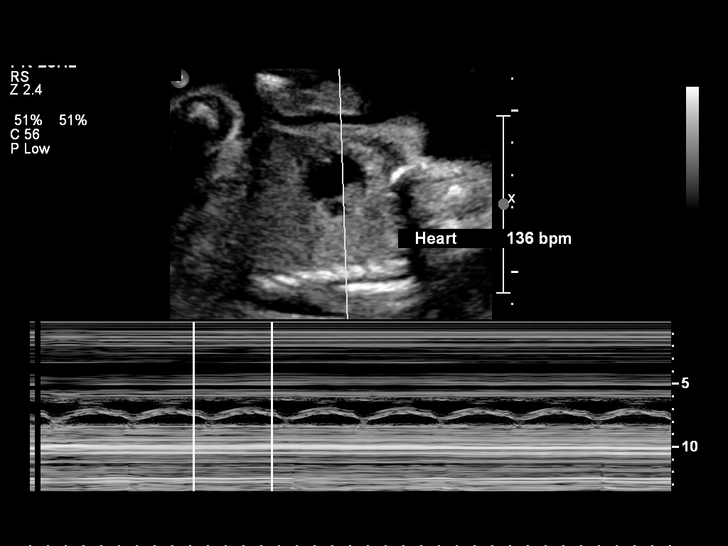
[im 5/35]
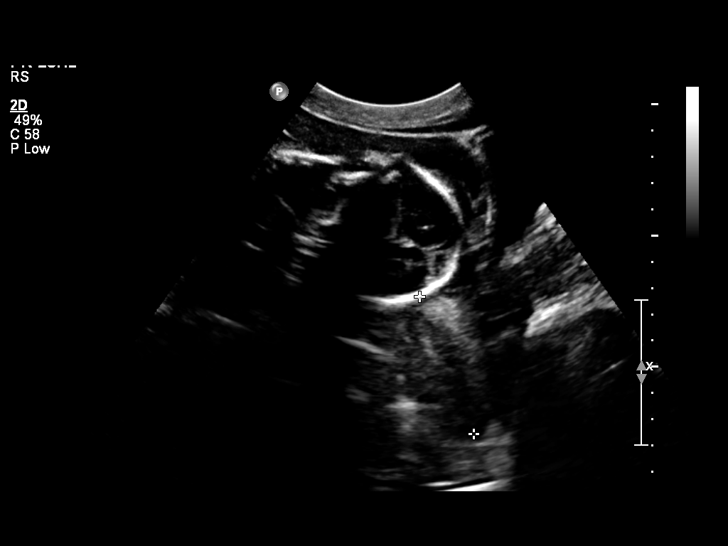
[im 9/35]
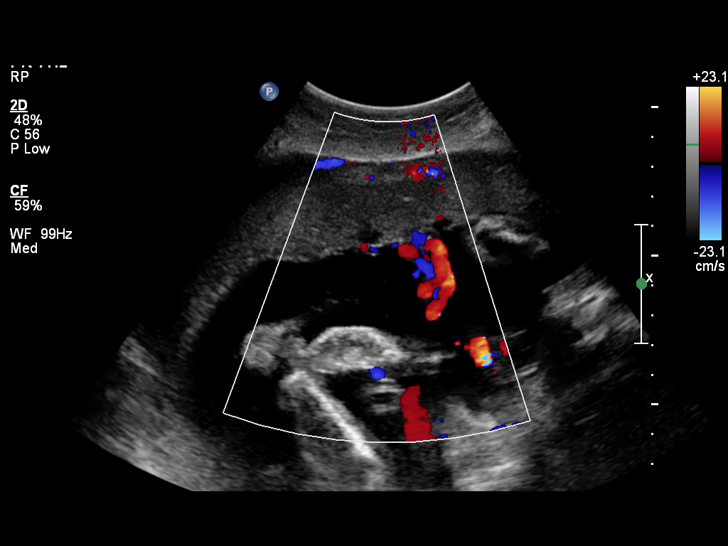
[im 11/35]
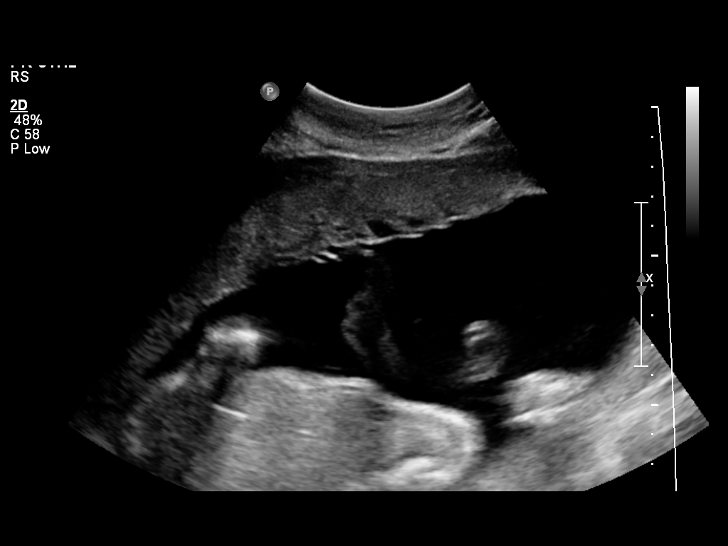
[im 14/35]
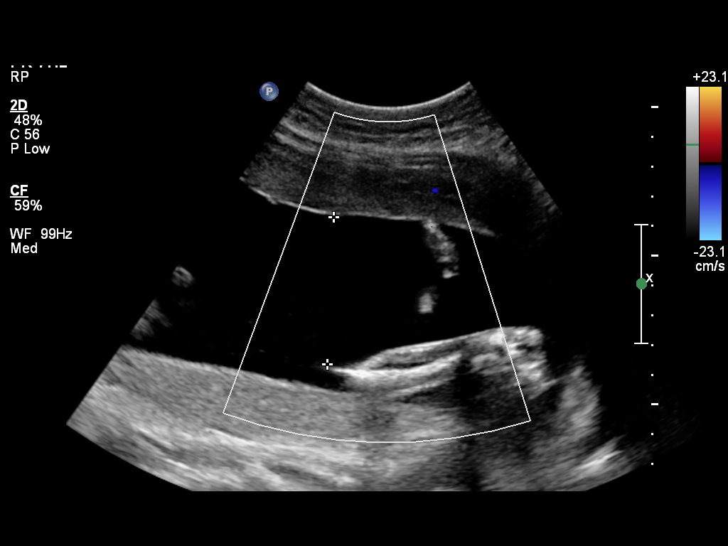
[im 18/35]
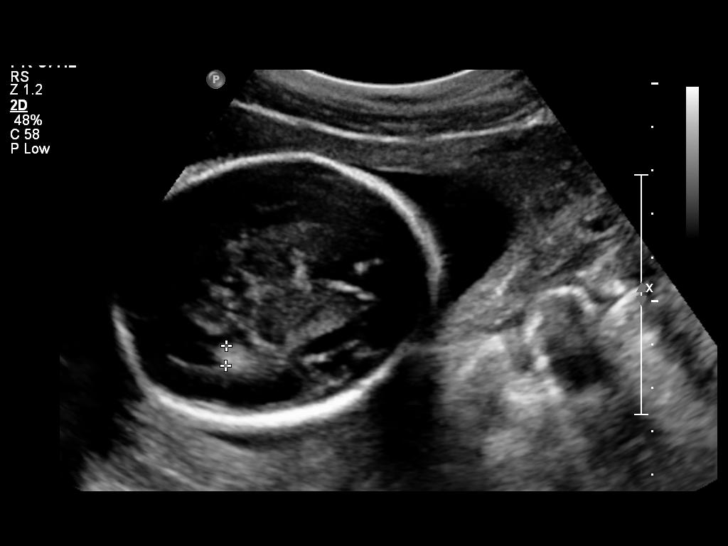
[im 21/35]
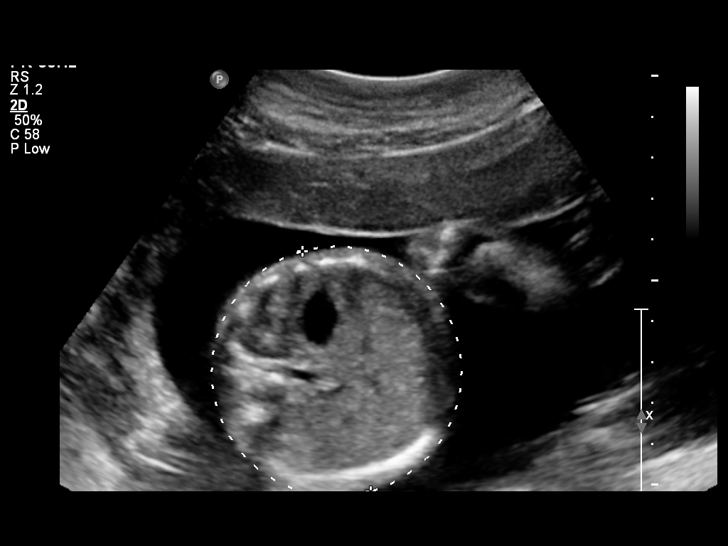
[im 24/35]
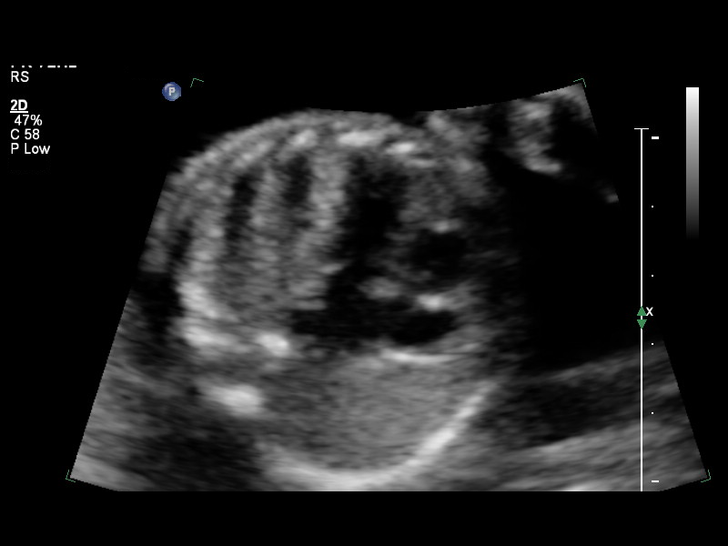
[im 28/35]
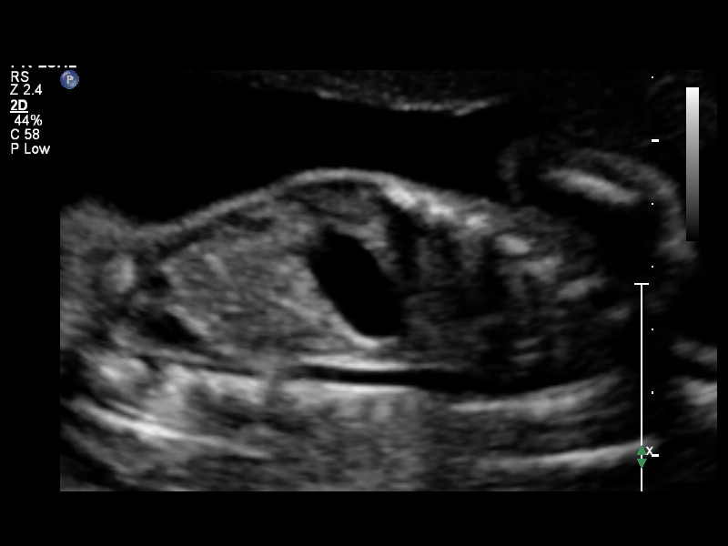
[im 30/35]
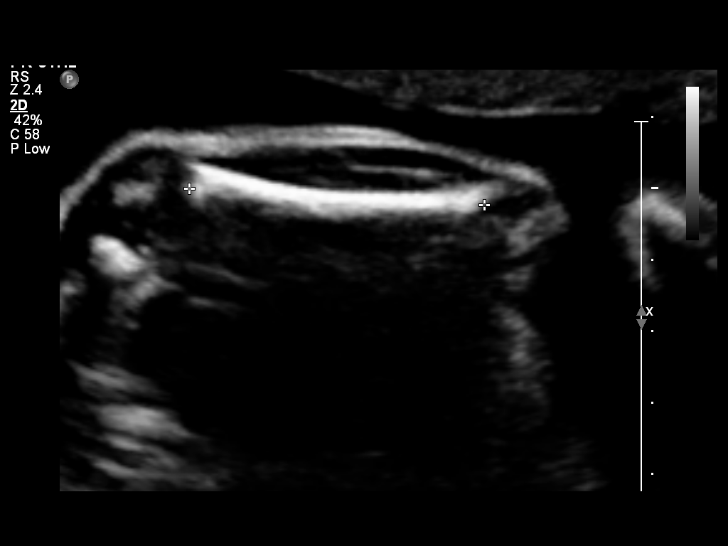
[im 33/35]
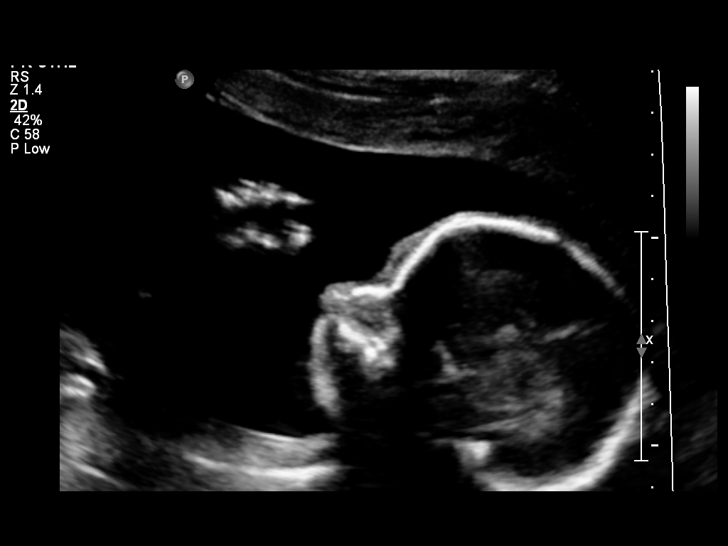

[12 of 28 positions shown; findings below may reference images not displayed]

OBSTETRICS REPORT
                      (Signed Final 08/23/2013 [DATE])

Service(s) Provided

 US OB FOLLOW UP                                       76816.1
Indications

 Advanced maternal age (37), Multigravida
 High risk pregnancy due to maternal drug abuse -      648.40, 305.90,
 Cocaine
 Bipolar/Schizophrenia - Risperdal
 Follow-up incomplete fetal anatomic evaluation
Fetal Evaluation

 Num Of Fetuses:    1
 Fetal Heart Rate:  136                          bpm
 Cardiac Activity:  Observed
 Presentation:      Cephalic
 Placenta:          Anterior, above cervical os
 P. Cord            Visualized, central
 Insertion:

 Amniotic Fluid
 AFI FV:      Subjectively within normal limits
 AFI Sum:     17.93   cm       70  %Tile     Larg Pckt:    6.29  cm
 RUQ:   4.26    cm   RLQ:    2.45   cm    LUQ:   6.29    cm   LLQ:    4.93   cm
Biometry

 BPD:     59.8  mm     G. Age:  24w 3d                CI:         74.5   70 - 86
                                                      FL/HC:      19.1   18.7 -

 HC:     219.9  mm     G. Age:  24w 0d       23  %    HC/AC:      1.13   1.05 -

 AC:     195.1  mm     G. Age:  24w 1d       39  %    FL/BPD:     70.2   71 - 87
 FL:        42  mm     G. Age:  23w 5d       20  %    FL/AC:      21.5   20 - 24

 Est. FW:     650  gm      1 lb 7 oz     46  %
Gestational Age

 U/S Today:     24w 0d                                        EDD:   12/13/13
 Best:          24w 2d     Det. By:  U/S C R L (05/31/13)     EDD:   12/11/13
Anatomy
 Cranium:          Appears normal         Aortic Arch:      Previously seen
 Fetal Cavum:      Previously seen        Ductal Arch:      Previously seen
 Ventricles:       Appears normal         Diaphragm:        Appears normal
 Choroid Plexus:   Previously seen        Stomach:          Appears normal
 Cerebellum:       Previously seen        Abdomen:          Previously seen
 Posterior Fossa:  Previously seen        Abdominal Wall:   Previously seen
 Nuchal Fold:      Previously seen        Cord Vessels:     Previously seen
 Face:             Orbits and profile     Kidneys:          Appear normal
                   previously seen
 Lips:             Previously seen        Bladder:          Appears normal
 Heart:            Appears normal         Spine:            Previously seen
                   (4CH, axis, and
                   situs)
 RVOT:             Not well visualized    Lower             Previously seen
                                          Extremities:
 LVOT:             Appears normal         Upper             Previously seen
                                          Extremities:

 Other:  Parents do not wish to know sex of fetus. Fetus appears to be a male.
         Heels and 5th digit prev. visualized. Nasal bone prev. visualized.
Targeted Anatomy

 Fetal Central Nervous System
 Lat. Ventricles:
Cervix Uterus Adnexa

 Cervical Length:    3.95     cm

 Cervix:       Normal appearance by transabdominal scan.
Impression

 IUP at 24+2 weeks
 Normal interval anatomy; anatomic survey complete except
 for RVOT
 Normal amniotic fluid volume
 Appropriate interval growth with EFW at the 46th %tile
Recommendations

 Follow-up ultrasound for growth in 
 6 weeks

 questions or concerns.

## 2015-04-17 ENCOUNTER — Encounter (HOSPITAL_COMMUNITY): Payer: Self-pay | Admitting: *Deleted

## 2015-04-17 ENCOUNTER — Emergency Department (HOSPITAL_COMMUNITY)
Admission: EM | Admit: 2015-04-17 | Discharge: 2015-04-21 | Disposition: A | Discharge status: Home / Self Care | Payer: Medicare Other | Attending: Emergency Medicine | Admitting: Emergency Medicine

## 2015-04-17 ENCOUNTER — Emergency Department (HOSPITAL_COMMUNITY)
Admission: EM | Admit: 2015-04-17 | Discharge: 2015-04-17 | Disposition: A | Discharge status: Other Acute Inpatient Hospital | Payer: Medicare Other | Attending: Emergency Medicine | Admitting: Emergency Medicine

## 2015-04-17 DIAGNOSIS — F1410 Cocaine abuse, uncomplicated: Secondary | ICD-10-CM | POA: Diagnosis not present

## 2015-04-17 DIAGNOSIS — F319 Bipolar disorder, unspecified: Secondary | ICD-10-CM | POA: Insufficient documentation

## 2015-04-17 DIAGNOSIS — Z720 Tobacco use: Secondary | ICD-10-CM | POA: Insufficient documentation

## 2015-04-17 DIAGNOSIS — F29 Unspecified psychosis not due to a substance or known physiological condition: Secondary | ICD-10-CM | POA: Diagnosis present

## 2015-04-17 DIAGNOSIS — Z79899 Other long term (current) drug therapy: Secondary | ICD-10-CM | POA: Diagnosis not present

## 2015-04-17 DIAGNOSIS — F209 Schizophrenia, unspecified: Secondary | ICD-10-CM | POA: Diagnosis present

## 2015-04-17 DIAGNOSIS — J45909 Unspecified asthma, uncomplicated: Secondary | ICD-10-CM | POA: Insufficient documentation

## 2015-04-17 DIAGNOSIS — F259 Schizoaffective disorder, unspecified: Secondary | ICD-10-CM | POA: Diagnosis not present

## 2015-04-17 LAB — CBC
HCT: 44.8 % (ref 36.0–46.0)
Hemoglobin: 14.5 g/dL (ref 12.0–15.0)
MCH: 28.5 pg (ref 26.0–34.0)
MCHC: 32.4 g/dL (ref 30.0–36.0)
MCV: 88.2 fL (ref 78.0–100.0)
Platelets: 211 10*3/uL (ref 150–400)
RBC: 5.08 MIL/uL (ref 3.87–5.11)
RDW: 12.9 % (ref 11.5–15.5)
WBC: 9.8 10*3/uL (ref 4.0–10.5)

## 2015-04-17 LAB — COMPREHENSIVE METABOLIC PANEL
ALK PHOS: 173 U/L — AB (ref 38–126)
ALT: 11 U/L — ABNORMAL LOW (ref 14–54)
AST: 29 U/L (ref 15–41)
Albumin: 4.7 g/dL (ref 3.5–5.0)
Anion gap: 22 — ABNORMAL HIGH (ref 5–15)
BILIRUBIN TOTAL: 1 mg/dL (ref 0.3–1.2)
BUN: 9 mg/dL (ref 6–20)
CHLORIDE: 106 mmol/L (ref 101–111)
CO2: 17 mmol/L — ABNORMAL LOW (ref 22–32)
CREATININE: 0.93 mg/dL (ref 0.44–1.00)
Calcium: 10.2 mg/dL (ref 8.9–10.3)
GFR calc Af Amer: >60 mL/min (ref >60)
GFR calc non Af Amer: >60 mL/min (ref >60)
Glucose, Bld: 98 mg/dL (ref 65–99)
POTASSIUM: 4.2 mmol/L (ref 3.5–5.1)
Sodium: 145 mmol/L (ref 135–145)
Total Protein: 8.5 g/dL — ABNORMAL HIGH (ref 6.5–8.1)

## 2015-04-17 LAB — SALICYLATE LEVEL: Salicylate Lvl: <4 mg/dL (ref 2.8–30.0)

## 2015-04-17 LAB — ACETAMINOPHEN LEVEL: Acetaminophen (Tylenol), Serum: <10 ug/mL — ABNORMAL LOW (ref 10–30)

## 2015-04-17 LAB — ETHANOL

## 2015-04-17 MED ORDER — LORAZEPAM 1 MG PO TABS: 1.0000 mg | ORAL_TABLET | Freq: Three times a day (TID) | ORAL | Status: DC | PRN

## 2015-04-17 MED ORDER — ONDANSETRON HCL 4 MG PO TABS: 4.0000 mg | ORAL_TABLET | Freq: Three times a day (TID) | ORAL | Status: DC | PRN

## 2015-04-17 MED ORDER — STERILE WATER FOR INJECTION IJ SOLN
INTRAMUSCULAR | Status: AC
  Administered 2015-04-17: 10 mL
  Filled 2015-04-17: qty 10

## 2015-04-17 MED ORDER — LORAZEPAM 1 MG PO TABS
1.0000 mg | ORAL_TABLET | Freq: Three times a day (TID) | ORAL | Status: DC | PRN
  Administered 2015-04-19: 1 mg via ORAL
  Filled 2015-04-17: qty 1

## 2015-04-17 MED ORDER — NICOTINE 21 MG/24HR TD PT24: 21.0000 mg | MEDICATED_PATCH | Freq: Every day | TRANSDERMAL | Status: DC

## 2015-04-17 MED ORDER — ZOLPIDEM TARTRATE 5 MG PO TABS: 5.0000 mg | ORAL_TABLET | Freq: Every evening | ORAL | Status: DC | PRN

## 2015-04-17 MED ORDER — BACITRACIN ZINC 500 UNIT/GM EX OINT
TOPICAL_OINTMENT | CUTANEOUS | Status: AC
  Filled 2015-04-17: qty 0.9

## 2015-04-17 MED ORDER — ZIPRASIDONE MESYLATE 20 MG IM SOLR
20.0000 mg | Freq: Once | INTRAMUSCULAR | Status: AC
  Administered 2015-04-17: 20 mg via INTRAMUSCULAR
  Filled 2015-04-17: qty 20

## 2015-04-17 MED ORDER — ACETAMINOPHEN 325 MG PO TABS
650.0000 mg | ORAL_TABLET | ORAL | Status: DC | PRN
Start: 2015-04-17 — End: 2015-04-21

## 2015-04-17 MED ORDER — ACETAMINOPHEN 325 MG PO TABS: 650.0000 mg | ORAL_TABLET | ORAL | Status: DC | PRN

## 2015-04-17 NOTE — BH Assessment (Signed)
Tele Assessment Note   Cassidy Thomas is an 39 y.o. female.  -Clinician spoke with Cassidy Mangle, PA regarding need for TTS.  Patient was brought to [facility] on IVC taken out by paternal grandfather.  Patient had to be given geodon  earlier by PA because she became physically combative with staff and [employer] officers.  She was handcuffed to stretcher when this clinician talked to her.  Patient was not able to give a straight answer to questions.  She did say "no" when asked if she wanted to kill herself.  Patient also said that she had thought about hurting others the other day but figured it was bad.  Patient says that she sees things "sliding."  She denies hearing voices although she was responding to internal stimuli per Cassidy Thomas the [place].  Patient does seem very disoriented to time and place with this clinician.  She cannot tell why she is at hospital, what her medications are or who her psychiatrist is.  Patient cannot answer most questions.    Clinician called paternal grandfather who took out the papers.  He was not a good source of information himself.  He says that patient stays with a boyfriend and her uncle.  She had been staying with uncle for a few days but he told her to go stay with grandfather.  Patient stayed with grandfather for about 2 days then when he told her she needed to leave she threatened to hit him.  At that point he took out IVC papers.  He reports she talks to dead relatives and acts like she is seeing things not present.  Patient also smokes crack according to PGF.    -Clinician discussed patient care with Cassidy Fess, NP who recommended patient be seen by psychiatry in AM to get first opinion completed.  Patient will remain in [facility].  Axis I: Bipolar, mixed and 295.90 Schizophrenia Axis II: Deferred Axis III:  Past Medical History  Diagnosis Date  . Schizophrenia   . Bipolar 1 disorder    Axis IV: economic problems, housing problems, occupational problems and other  psychosocial or environmental problems Axis V: 21-30 behavior considerably influenced by delusions or hallucinations OR serious impairment in judgment, communication OR inability to function in almost all areas  Past Medical History:  Past Medical History  Diagnosis Date  . Schizophrenia   . Bipolar 1 disorder     History reviewed. No pertinent past surgical history.  Family History: History reviewed. No pertinent family history.  Social History:  reports that she uses illicit drugs. Her tobacco and alcohol histories are not on file.  Additional Social History:  Alcohol / Drug Use Pain Medications: Pt could not say what her medications were. Prescriptions: Pt could not identify her medications. Over the Counter: Unknown. History of alcohol / drug use?: No history of alcohol / drug abuse (UDS not done.  PGF says she uses crack.)  CIWA: CIWA-Ar BP: 133/82 mmHg Pulse Rate: 71 COWS:    PATIENT STRENGTHS: (choose at least two) Average or above average intelligence Communication skills  Allergies: Allergies not on file  Home Medications:  (Not in a hospital admission)  OB/GYN Status:  No LMP recorded.  General Assessment Data Location of Assessment: [facility] TTS Assessment: In system Is this a Tele or Face-to-Face Assessment?: Face-to-Face Is this an Initial Assessment or a Re-assessment for this encounter?: Initial Assessment Marital status: Single Is patient pregnant?: No Pregnancy Status: No Living Arrangements: Other relatives, Non-relatives/Friends (Stays between bf, uncle and  PGF) Can pt return to current living arrangement?: Yes Admission Status: Involuntary Is patient capable of signing voluntary admission?: No Referral Source: Other Insurance type: Unknown     Crisis Care Plan Living Arrangements: Other relatives, Non-relatives/Friends (Stays between bf, uncle and PGF) Name of Psychiatrist: Unknown, maybe [facility] Name of Therapist: Unable to get answer from  patient  Education Status Is patient currently in school?: No  Risk to self with the past 6 months Suicidal Ideation: No Has patient been a risk to self within the past 6 months prior to admission? : Other (comment) (Unknown) Suicidal Intent: No Has patient had any suicidal intent within the past 6 months prior to admission? : No Is patient at risk for suicide?: No Suicidal Plan?: No Has patient had any suicidal plan within the past 6 months prior to admission? : No Access to Means: No What has been your use of drugs/alcohol within the last 12 months?: UDS not complete Previous Attempts/Gestures:  (Unknown) How many times?:  (Unknown) Other Self Harm Risks: Not taking medications Triggers for Past Attempts: Unknown, Unpredictable Intentional Self Injurious Behavior: None Family Suicide History: Unknown Recent stressful life event(s): Other (Comment) (Not taking psychiatric meds) Persecutory voices/beliefs?: No Depression: No Depression Symptoms:  (Pt does not report depressive symptoms) Substance abuse history and/or treatment for substance abuse?: Yes (UDS not back.  PGF says she uses crack.) Suicide prevention information given to non-admitted patients: Not applicable  Risk to Others within the past 6 months Homicidal Ideation: No Does patient have any lifetime risk of violence toward others beyond the six months prior to admission? : Yes (comment) (Pt started fighting with hospital staff in triage.) Thoughts of Harm to Others: No-Not Currently Present/Within Last 6 Months Current Homicidal Intent: No Current Homicidal Plan: No Access to Homicidal Means: No Identified Victim: No one History of harm to others?: Yes Assessment of Violence: On admission Violent Behavior Description: Hitting hospital staff Does patient have access to weapons?: No Criminal Charges Pending?: No Does patient have a court date: No Is patient on probation?: No  Psychosis Hallucinations:  Auditory, Visual (Talking to dead relatives, seeing things sliding) Delusions: None noted  Mental Status Report Appearance/Hygiene: Disheveled, Body odor, In scrubs Eye Contact: Fair Motor Activity: Restlessness (Pt handcuffed to bed.) Speech: Incoherent, Soft Level of Consciousness: Alert Mood: Empty Affect: Flat (Pt had been given geodon earlier) Anxiety Level: Moderate Thought Processes: Irrelevant, Flight of Ideas Judgement: Impaired Orientation: Unable to assess Obsessive Compulsive Thoughts/Behaviors: Unable to Assess  Cognitive Functioning Concentration: Poor Memory: Unable to Assess IQ: Average Insight: Poor Impulse Control: Poor Appetite:  (Unable to assess) Weight Loss: 0 Weight Gain: 0 Sleep: Unable to Assess Total Hours of Sleep:  (Unable to assess.) Vegetative Symptoms: Unable to Assess  ADLScreening [facility] Assessment Services) Patient's cognitive ability adequate to safely complete daily activities?: Yes Patient able to express need for assistance with ADLs?: Yes Independently performs ADLs?: Yes (appropriate for developmental age)  Prior Inpatient Therapy Prior Inpatient Therapy: Yes Prior Therapy Dates: Numerous Prior Therapy Facilty/Provider(s): [facility], other facilities Reason for Treatment: schizophrenia  Prior Outpatient Therapy Prior Outpatient Therapy: Yes Prior Therapy Dates: Current? Prior Therapy Facilty/Provider(s): [facility]? Reason for Treatment: med management Does patient have an ACCT team?: No Does patient have Intensive In-House Services?  : No Does patient have [facility] services? : Unknown Does patient have P4CC services?: No  ADL Screening (condition at time of admission) Patient's cognitive ability adequate to safely complete daily activities?: Yes Is the patient deaf or  have difficulty hearing?: No Does the patient have difficulty seeing, even when wearing glasses/contacts?: No Does the patient have difficulty concentrating,  remembering, or making decisions?: Yes Patient able to express need for assistance with ADLs?: Yes Does the patient have difficulty dressing or bathing?: No Independently performs ADLs?: Yes (appropriate for developmental age) Does the patient have difficulty walking or climbing stairs?: No Weakness of Legs: None Weakness of Arms/Hands: None  Home Assistive Devices/Equipment Home Assistive Devices/Equipment: None    Abuse/Neglect Assessment (Assessment to be complete while patient is alone) Physical Abuse: Denies (Pt did not respond.) Verbal Abuse: Denies (Pt did not respond) Sexual Abuse: Denies (Pt did not respond) Exploitation of patient/patient's resources: Denies Self-Neglect: Denies     Merchant navy officer (For Healthcare) Does patient have an advance directive?: No Would patient like information on creating an advanced directive?: No - patient declined information    Additional Information 1:1 In Past 12 Months?: No CIRT Risk: Yes Elopement Risk: Yes Does patient have medical clearance?: No     Disposition:  Disposition Initial Assessment Completed for this Encounter: Yes Disposition of Patient: Inpatient treatment program, Referred to Type of inpatient treatment program: Adult Patient referred to:  (Pt to be seen by psychiatry in AM to review IVC per Christen Bame.)  Beatriz Stallion Ray 04/17/2015 9:29 PM

## 2015-04-17 NOTE — ED Notes (Signed)
Pt becoming hostile to staff, pt trying to bite, kick, scratch, and hit staff. [employer] and staff restraining pt. Both pts wrist handcuffed to stretcher  

## 2015-04-17 NOTE — ED Notes (Signed)
Pt is IVC, paperwork states pt is dx with schizophrenia and bipolar, pt has been prescribed psychotropic medications but the plaintiff does not know the names. Pt has not been taking medications. Pt reports auditory hallucinations and voices are telling her to do things. Pt talks to long deceased relatives. The pt uses crack cocaine and marijuana. Pt is hostile and aggressive towards family members. Pt has hx of being committed. Pt is danger to herself and others".

## 2015-04-17 NOTE — ED Notes (Signed)
Pt given 20mg  Geodon IM L deltoid.

## 2015-04-17 NOTE — ED Provider Notes (Signed)
CSN: [id number]     Arrival date & time 04/17/15  1809 History   First MD Initiated Contact with Patient 04/17/15 1830     No chief complaint on file.    (Consider location/radiation/quality/duration/timing/severity/associated sxs/prior Treatment) Patient is a 39 y.o. female presenting with mental health disorder. The history is provided by the patient. No language interpreter was used.  Mental Health Problem Presenting symptoms: disorganized thought process and hallucinations   Patient accompanied by:  Law enforcement Degree of incapacity (severity):  Moderate Onset quality:  Unable to specify Timing:  Constant Progression:  Worsening Chronicity:  New Treatment compliance:  Untreated Relieved by:  Nothing Worsened by:  Nothing tried Ineffective treatments:  None tried Associated symptoms: distractible   Pt here with police.   They report pt has IVC papers pending.  Pt has a history of schizophrenia  No past medical history on file. No past surgical history on file. No family history on file. History  Substance Use Topics  . Smoking status: Not on file  . Smokeless tobacco: Not on file  . Alcohol Use: Not on file   OB History    No data available     Review of Systems  Psychiatric/Behavioral: Positive for hallucinations.  All other systems reviewed and are negative.     Allergies  Review of patient's allergies indicates not on file.  Home Medications   Prior to Admission medications   Not on File   There were no vitals taken for this visit. Physical Exam  Constitutional: She appears well-developed and well-nourished.  Skin: Skin is warm.  Nursing note and vitals reviewed.   ED Course  Procedures (including critical care time) Labs Review Labs Reviewed  ACETAMINOPHEN LEVEL - Abnormal; Notable for the following:    Acetaminophen (Tylenol), Serum <10 (*)    All other components within normal limits  COMPREHENSIVE METABOLIC PANEL - Abnormal; Notable for  the following:    CO2 17 (*)    Total Protein 8.5 (*)    ALT 11 (*)    Alkaline Phosphatase 173 (*)    Anion gap 22 (*)    All other components within normal limits  CBC  ETHANOL  SALICYLATE LEVEL  URINE RAPID DRUG SCREEN, HOSP PERFORMED    Imaging Review No results found.   EKG Interpretation None      MDM Pt combative with staff.  Pt given geodon 20mg  IM.  Medical screening labs obtained.     Final diagnoses:  Psychosis, unspecified psychosis type        Elson Areas, PA-C 04/17/15 1931  Lonia Skinner [place], PA-C 04/17/15 2021  Lonia Skinner [place], PA-C 04/17/15 2136  Rolland Porter, MD 04/25/15 [phone]

## 2015-04-17 NOTE — ED Provider Notes (Signed)
CSN: [id number]     Arrival date & time 04/17/15  1906 History   None    Chief Complaint  Patient presents with  . psychotic      (Consider location/radiation/quality/duration/timing/severity/associated sxs/prior Treatment) HPI  Past Medical History  Diagnosis Date  . Bipolar 1 disorder   . Schizophrenia   . Asthma   . Substance abuse    Past Surgical History  Procedure Laterality Date  . Breast lumpectomy  2012    right breast   Family History  Problem Relation Age of Onset  . Cancer Maternal Grandmother    History  Substance Use Topics  . Smoking status: Current Some Day Smoker -- 0.25 packs/day  . Smokeless tobacco: Never Used  . Alcohol Use: No   OB History    Gravida Para Term Preterm AB TAB SAB Ectopic Multiple Living   2 2 2  0 0 0 0 0 0 2     Review of Systems    Allergies  Other  Home Medications   Prior to Admission medications   Medication Sig Start Date End Date Taking? Authorizing Provider  ibuprofen (ADVIL,MOTRIN) 600 MG tablet Take 1 tablet (600 mg total) by mouth every 6 (six) hours. 12/13/13   Arabella Merles, CNM  lithium carbonate 300 MG capsule Take 300 mg by mouth 2 (two) times daily with a meal.    Historical Provider, MD  polyethylene glycol (MIRALAX / GLYCOLAX) packet Take 17 g by mouth daily as needed.    Historical Provider, MD  Prenatal Vit-Fe Fumarate-FA (PRENATAL MULTIVITAMIN) TABS tablet Take 1 tablet by mouth daily at 12 noon.    Historical Provider, MD  risperiDONE (RISPERDAL) 1 MG tablet Take 1 mg by mouth at bedtime.     Historical Provider, MD   BP 133/82 mmHg  Pulse 71  Temp(Src) 98.1 F (36.7 C) (Oral)  Resp 16  SpO2 100% Physical Exam  ED Course  Procedures (including critical care time) Labs Review Labs Reviewed  URINE RAPID DRUG SCREEN, HOSP PERFORMED    Imaging Review No results found.   EKG Interpretation None      MDM   Final diagnoses:  None    (There is a complete note on this pt under the  name of Veto Kemps, this note is suppose to be merged per registration)    Elson Areas, PA-C 04/17/15 1933  Rolland Porter, MD 04/25/15 1346

## 2015-04-17 NOTE — ED Notes (Signed)
Pt with IVC papers, presents with complaint bizarre behavior, talking to deceased relatives, not taking medications and abusing crack cocaine and marijuana.  Pt hostile and aggressive towards family members.  Pt agitated in triage, given Geodon 20 mgs IM prior to arrival in psych ed. AAO x 3, no acute distress noted, cooperative at present.  Pt voicing that she is hungry, meal given.  Monitoring for safety, Q 15 min checks in effect.

## 2015-04-17 NOTE — ED Notes (Addendum)
Registration arrived pt incorrectly at first, blood work already and ordered and resulted in other chart.   CBC Resulted  WBC: 9.8 [Range: 4.0 - 10.5 K/uL]  RBC: 5.08 [Range: 3.87 - 5.11 MIL/uL]  Hemoglobin: 14.5 [Range: 12.0 - 15.0 g/dL]  HCT: 54.6 [Range: 50.3 - 46.0 %]  MCV: 88.2 [Range: 78.0 - 100.0 fL]  MCH: 28.5 [Range: 26.0 - 34.0 pg]  MCHC: 32.4 [Range: 30.0 - 36.0 g/dL]  RDW: 54.6 [Range: 56.8 - 15.5 %]  Platelets: 211 [Range: 150 - 400 K/uL]  Collected: 04/17/2015 18:54 Last updated: 04/17/2015 19:08

## 2015-04-17 NOTE — ED Notes (Signed)
Pt becoming hostile to staff, pt trying to bite, kick, scratch, and hit staff. [employer] and staff restraining pt. Both pts wrist handcuffed to stretcher

## 2015-04-18 DIAGNOSIS — F2089 Other schizophrenia: Secondary | ICD-10-CM | POA: Insufficient documentation

## 2015-04-18 DIAGNOSIS — F259 Schizoaffective disorder, unspecified: Secondary | ICD-10-CM | POA: Diagnosis not present

## 2015-04-18 LAB — RAPID URINE DRUG SCREEN, HOSP PERFORMED
AMPHETAMINES: NOT DETECTED
BENZODIAZEPINES: NOT DETECTED
Barbiturates: NOT DETECTED
Cocaine: POSITIVE — AB
Opiates: NOT DETECTED
Tetrahydrocannabinol: NOT DETECTED

## 2015-04-18 MED ORDER — RISPERIDONE 1 MG PO TABS
1.0000 mg | ORAL_TABLET | Freq: Two times a day (BID) | ORAL | Status: DC
  Administered 2015-04-18 – 2015-04-21 (×7): 1 mg via ORAL
  Filled 2015-04-18 (×7): qty 1

## 2015-04-18 MED ORDER — LITHIUM CARBONATE ER 300 MG PO TBCR
300.0000 mg | EXTENDED_RELEASE_TABLET | Freq: Two times a day (BID) | ORAL | Status: DC
  Administered 2015-04-18 – 2015-04-21 (×7): 300 mg via ORAL
  Filled 2015-04-18 (×8): qty 1

## 2015-04-18 MED ORDER — AMANTADINE HCL 100 MG PO CAPS
100.0000 mg | ORAL_CAPSULE | Freq: Two times a day (BID) | ORAL | Status: DC
  Administered 2015-04-18 – 2015-04-21 (×7): 100 mg via ORAL
  Filled 2015-04-18 (×8): qty 1

## 2015-04-18 NOTE — Progress Notes (Signed)
pcp is [facility] [address] [place], [place] [address] [phone]

## 2015-04-18 NOTE — ED Notes (Signed)
Pt spit out her medication three times before swallowing them.

## 2015-04-18 NOTE — BH Assessment (Signed)
[facility] Assessment Progress Note  The following facilities have been contacted to seek placement for this pt, with results as noted:  Beds available, information sent, decision pending:  [facility]  At capacity:  [facility]   Tyrene Nader, MA Triage Specialist [phone]     

## 2015-04-18 NOTE — ED Notes (Signed)
D:Pt is alert this morning and suspicious. Pt asked writer "Is the women ok?" Pt could not explain a women's name or what she was referring. Pt did complete urine test when requested and had to be directed to the restroom. Pt verbally denied si and hi. She verbally denied hallucinations and was reluctant to answer taking extra time. A:Offered support and redirection. R:Safety maintained in the [facility].

## 2015-04-18 NOTE — Consult Note (Signed)
[facility] Face-to-Face Psychiatry Consult   Reason for Consult:  Schizoaffective disorder Referring Physician:  EDP Patient Identification: Cassidy Thomas MRN:  [id number] Principal Diagnosis: Schizoaffective disorder Diagnosis:   Patient Active Problem List   Diagnosis Date Noted  . Schizoaffective disorder [F25.9] 01/14/2014    Priority: High  . Mental retardation [F79] 01/14/2014  . Schizophrenia [F20.9] 05/31/2013  . AMA (advanced maternal age) multigravida 35+ [O09.529] 05/31/2013    Total Time spent with patient: 1 hour  Subjective:   Cassidy Thomas is a 39 y.o. female patient admitted with Schizoaffective disorder.  HPI: AA female, 39 years was evaluated for confusion and disorganized thinking.  Patient was IVC by her grandfather for not taking her medications for Bipolar and Schizophrenia.  Patient, this morning could not participate in the interview and could not explain the purpose for this admission.  Patient was tangential and did not answer any question correctly.  Her UDS is positive for Cocaine and Marijuana.  Patient has been accepted for admission and we will be seeking bed placement at any facility with available beds.  HPI Elements:   Location:  Schizoaffective disorder, Schizophrenia. Quality:  severe. Severity:  severe. Timing:  acute. Duration:  Chronic mental illness. Context:  IVC by her grandfather for not taking medications and having hallucinations..  Past Medical History:  Past Medical History  Diagnosis Date  . Bipolar 1 disorder   . Schizophrenia   . Asthma   . Substance abuse     Past Surgical History  Procedure Laterality Date  . Breast lumpectomy  2012    right breast   Family History:  Family History  Problem Relation Age of Onset  . Cancer Maternal Grandmother    Social History:  History  Alcohol Use No     History  Drug Use  . Yes  . Special: Cocaine, "Crack" cocaine    Comment: psdt histoyr. last use of crack cocaine was last  month ago.     History   Social History  . Marital Status: Single    Spouse Name: N/A  . Number of Children: N/A  . Years of Education: N/A   Social History Main Topics  . Smoking status: Current Some Day Smoker -- 0.25 packs/day  . Smokeless tobacco: Never Used  . Alcohol Use: No  . Drug Use: Yes    Special: Cocaine, "Crack" cocaine     Comment: psdt histoyr. last use of crack cocaine was last month ago.   Marland Kitchen Sexual Activity: No   Other Topics Concern  . None   Social History Narrative   Additional Social History:                          Allergies:   Allergies  Allergen Reactions  . Other Other (See Comments)    Patient is allergic to eye drops causes eyes to swell    Labs:  Results for orders placed or performed during the hospital encounter of 04/17/15 (from the past 48 hour(s))  Urine rapid drug screen (hosp performed)not at [facility]     Status: Abnormal   Collection Time: 04/18/15  7:48 AM  Result Value Ref Range   Opiates NONE DETECTED NONE DETECTED   Cocaine POSITIVE (A) NONE DETECTED   Benzodiazepines NONE DETECTED NONE DETECTED   Amphetamines NONE DETECTED NONE DETECTED   Tetrahydrocannabinol NONE DETECTED NONE DETECTED   Barbiturates NONE DETECTED NONE DETECTED    Comment:  DRUG SCREEN FOR MEDICAL PURPOSES ONLY.  IF CONFIRMATION IS NEEDED FOR ANY PURPOSE, NOTIFY LAB WITHIN 5 DAYS.        LOWEST DETECTABLE LIMITS FOR URINE DRUG SCREEN Drug Class       Cutoff (ng/mL) Amphetamine      1000 Barbiturate      200 Benzodiazepine   200 Tricyclics       300 Opiates          300 Cocaine          300 THC              50     Vitals: Blood pressure 119/70, pulse 66, temperature 98.7 F (37.1 C), temperature source Oral, resp. rate 18, SpO2 100 %, not currently breastfeeding.  Risk to Self: Is patient at risk for suicide?: No, but patient needs Medical Clearance (see triage note. Pt IVC, psychotic, talking about killing EMS, hostile, trying  to kick, bite, and hit staff. ) Risk to Others:   Prior Inpatient Therapy:   Prior Outpatient Therapy:    Current Facility-Administered Medications  Medication Dose Route Frequency Provider Last Rate Last Dose  . acetaminophen (TYLENOL) tablet 650 mg  650 mg Oral Q4H PRN Elson Areas, PA-C      . amantadine (SYMMETREL) capsule 100 mg  100 mg Oral BID Elijah Michaelis   100 mg at 04/18/15 1221  . lithium carbonate (LITHOBID) CR tablet 300 mg  300 mg Oral Q12H Camora Tremain   300 mg at 04/18/15 1221  . LORazepam (ATIVAN) tablet 1 mg  1 mg Oral Q8H PRN Elson Areas, PA-C      . ondansetron [facility]) tablet 4 mg  4 mg Oral Q8H PRN Elson Areas, PA-C      . risperiDONE (RISPERDAL) tablet 1 mg  1 mg Oral BID Madelynne Lasker   1 mg at 04/18/15 1221  . zolpidem (AMBIEN) tablet 5 mg  5 mg Oral QHS PRN Elson Areas, PA-C       Current Outpatient Prescriptions  Medication Sig Dispense Refill  . lithium carbonate 150 MG capsule Take 150-300 mg by mouth 2 (two) times daily with a meal. Take 1 capsule (150 mg) in the am and Take 2 capsules (300 mg) in the pm.    . risperiDONE (RISPERDAL) 1 MG tablet Take 1 mg by mouth at bedtime.     Marland Kitchen ibuprofen (ADVIL,MOTRIN) 600 MG tablet Take 1 tablet (600 mg total) by mouth every 6 (six) hours. 30 tablet 0  . polyethylene glycol (MIRALAX / GLYCOLAX) packet Take 17 g by mouth daily as needed.    . Prenatal Vit-Fe Fumarate-FA (PRENATAL MULTIVITAMIN) TABS tablet Take 1 tablet by mouth daily at 12 noon.      Musculoskeletal: Strength & Muscle Tone: within normal limits Gait & Station: normal Patient leans: N/A  Psychiatric Specialty Exam: Physical Exam  Review of Systems  Unable to perform ROS: mental acuity  Constitutional: Negative.   HENT: Negative.   Eyes: Negative.   Respiratory: Negative.   Cardiovascular: Negative.   Gastrointestinal: Negative.   Genitourinary: Negative.   Musculoskeletal: Negative.   Skin: Negative.   Neurological:  Negative.   Endo/Heme/Allergies: Negative.     Blood pressure 119/70, pulse 66, temperature 98.7 F (37.1 C), temperature source Oral, resp. rate 18, SpO2 100 %, not currently breastfeeding.There is no weight on file to calculate BMI.  General Appearance: Casual and Disheveled  Eye Contact::  Poor  Speech:  Slow  Volume:  incoherent  Mood:  Anxious and Irritable  Affect:  Congruent  Thought Process:  Disorganized, Linear and Tangential  Orientation:  Other:  unable to obtain  Thought Content:  Hallucinations: Auditory Visual  Suicidal Thoughts:  unable to obtain  Homicidal Thoughts:  unable to obtain  Memory:  unable to obtain  Judgement:  Poor  Insight:  Shallow  Psychomotor Activity:  Restlessness  Concentration:  Poor  Recall:  NA  Fund of Knowledge:Poor  Language: Poor  Akathisia:  NA  Handed:  Right  AIMS (if indicated):     Assets:  Desire for Improvement  ADL's:  Impaired  Cognition: Impaired,  Severe  Sleep:      Medical Decision Making: Review of Psycho-Social Stressors (1), Established Problem, Worsening (2), Review of Medication Regimen & Side Effects (2) and Review of New Medication or Change in Dosage (2)  Plan:  We have resumed all of her home medications.  Offer Ativan as needed for agitation.  Offer Ambien 5 mg po at bed time for sleep.  Disposition: Admit and seek placement   Earney Navy    PMHNP-BC  04/18/2015 2:28 PM Patient seen face-to-face for psychiatric evaluation, chart reviewed and case discussed with the physician extender and developed treatment plan. Reviewed the information documented and agree with the treatment plan. Thedore Mins, MD

## 2015-04-19 DIAGNOSIS — F259 Schizoaffective disorder, unspecified: Secondary | ICD-10-CM

## 2015-04-19 MED ORDER — TRAZODONE HCL 50 MG PO TABS: 50.0000 mg | ORAL_TABLET | Freq: Every evening | ORAL | Status: DC | PRN

## 2015-04-19 NOTE — ED Notes (Signed)
Pt. noted in room. No complaints or concerns voiced. No distress or abnormal behavior noted. Will continue to monitor with security cameras. Q 15 minute rounds continue. 

## 2015-04-19 NOTE — ED Notes (Signed)
Pt. Noted sleeping in room. No complaints or concerns voiced. No distress or abnormal behavior noted. Will continue to monitor with security cameras. Q 15 minute rounds continue. 

## 2015-04-19 NOTE — ED Notes (Signed)
Pt is awake and alert, refusing medication. Treatment team made aware will continue to monitor for safety. Q15 min check tlewis RN

## 2015-04-19 NOTE — Progress Notes (Signed)
Disposition CSW completed referrals for patient to the following inpatient psych facilities:  [facility] [place]  CSW will continue to assist with patient's placement needs.  [facility] Disposition CSW [phone]

## 2015-04-19 NOTE — Consult Note (Signed)
[facility] Face-to-Face Psychiatry Consult   Reason for Consult:  Schizoaffective disorder Referring Physician:  EDP Patient Identification: Cassidy Thomas MRN:  [id number] Principal Diagnosis: Schizoaffective disorder  Diagnosis:   Patient Active Problem List   Diagnosis Date Noted  . 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 Schizoaffective disorder [F25.9] 01/14/2014  . Mental retardation [F79] 01/14/2014  . Schizophrenia [F20.9] 05/31/2013  . AMA (advanced maternal age) multigravida 35+ [O09.529] 05/31/2013    Total Time spent with patient: 25 minutes  Subjective:   Cassidy Thomas is a 39 y.o. female patient admitted with Schizoaffective disorder. Pt seen and chart reviewed with Dr. Elsie Saas and Dahlia Byes, NP. Pt continues to present as tangential, disorganized, and psychotic. Pt continues to meet inpatient criteria and we are seeking placement.   HPI: AA female, 39 years was evaluated for confusion and disorganized thinking.  Patient was IVC by her grandfather for not taking her medications for Bipolar and Schizophrenia.  Patient, this morning could not participate in the interview and could not explain the purpose for this admission.  Patient was tangential and did not answer any question correctly.  Her UDS is positive for Cocaine and Marijuana.  Patient has been accepted for admission and we will be seeking bed placement at any facility with available beds.  HPI Elements:   Location:  Schizoaffective disorder, Schizophrenia. Quality:  severe. Severity:  severe. Timing:  acute. Duration:  Chronic mental illness. Context:  IVC by her grandfather for not taking medications and having hallucinations..  Past Medical History:  Past Medical History  Diagnosis Date  . Bipolar 1 disorder   . Schizophrenia   . Asthma   . Substance abuse     Past Surgical History  Procedure Laterality Date  . Breast lumpectomy  2012    right breast   Family History:  Family History   Problem Relation Age of Onset  . Cancer Maternal Grandmother    Social History:  History  Alcohol Use No     History  Drug Use  . Yes  . Special: Cocaine, "Crack" cocaine    Comment: psdt histoyr. last use of crack cocaine was last month ago.     History   Social History  . Marital Status: Single    Spouse Name: N/A  . Number of Children: N/A  . Years of Education: N/A   Social History Main Topics  . Smoking status: Current Some Day Smoker -- 0.25 packs/day  . Smokeless tobacco: Never Used  . Alcohol Use: No  . Drug Use: Yes    Special: Cocaine, "Crack" cocaine     Comment: psdt histoyr. last use of crack cocaine was last month ago.   Marland Kitchen Sexual Activity: No   Other Topics Concern  . None   Social History Narrative   Additional Social History:                          Allergies:   Allergies  Allergen Reactions  . Other Other (See Comments)    Patient is allergic to eye drops causes eyes to swell    Labs:  Results for orders placed or performed during the hospital encounter of 04/17/15 (from the past 48 hour(s))  Urine rapid drug screen (hosp performed)not at [facility]     Status: Abnormal   Collection Time: 04/18/15  7:48 AM  Result Value Ref Range   Opiates NONE DETECTED NONE DETECTED   Cocaine POSITIVE (A) NONE DETECTED  Benzodiazepines NONE DETECTED NONE DETECTED   Amphetamines NONE DETECTED NONE DETECTED   Tetrahydrocannabinol NONE DETECTED NONE DETECTED   Barbiturates NONE DETECTED NONE DETECTED    Comment:        DRUG SCREEN FOR MEDICAL PURPOSES ONLY.  IF CONFIRMATION IS NEEDED FOR ANY PURPOSE, NOTIFY LAB WITHIN 5 DAYS.        LOWEST DETECTABLE LIMITS FOR URINE DRUG SCREEN Drug Class       Cutoff (ng/mL) Amphetamine      1000 Barbiturate      200 Benzodiazepine   200 Tricyclics       300 Opiates          300 Cocaine          300 THC              50     Vitals: Blood pressure 149/62, pulse 102, temperature 98.9 F (37.2 C),  temperature source Oral, resp. rate 18, SpO2 100 %, not currently breastfeeding.  Risk to Self: Is patient at risk for suicide?: No, but patient needs Medical Clearance (see triage note. Pt IVC, psychotic, talking about killing EMS, hostile, trying to kick, bite, and hit staff. ) Risk to Others:   Prior Inpatient Therapy:   Prior Outpatient Therapy:    Current Facility-Administered Medications  Medication Dose Route Frequency Provider Last Rate Last Dose  . acetaminophen (TYLENOL) tablet 650 mg  650 mg Oral Q4H PRN Elson Areas, PA-C      . amantadine (SYMMETREL) capsule 100 mg  100 mg Oral BID Mojeed Akintayo   100 mg at 04/19/15 1350  . lithium carbonate (LITHOBID) CR tablet 300 mg  300 mg Oral Q12H Mojeed Akintayo   300 mg at 04/19/15 1350  . LORazepam (ATIVAN) tablet 1 mg  1 mg Oral Q8H PRN Elson Areas, PA-C   1 mg at 04/19/15 1350  . ondansetron (ZOFRAN) tablet 4 mg  4 mg Oral Q8H PRN Elson Areas, PA-C      . risperiDONE (RISPERDAL) tablet 1 mg  1 mg Oral BID Mojeed Akintayo   1 mg at 04/19/15 1350  . zolpidem (AMBIEN) tablet 5 mg  5 mg Oral QHS PRN Elson Areas, PA-C       Current Outpatient Prescriptions  Medication Sig Dispense Refill  . lithium carbonate 150 MG capsule Take 150-300 mg by mouth 2 (two) times daily with a meal. Take 1 capsule (150 mg) in the am and Take 2 capsules (300 mg) in the pm.    . risperiDONE (RISPERDAL) 1 MG tablet Take 1 mg by mouth at bedtime.     Marland Kitchen ibuprofen (ADVIL,MOTRIN) 600 MG tablet Take 1 tablet (600 mg total) by mouth every 6 (six) hours. 30 tablet 0  . polyethylene glycol (MIRALAX / GLYCOLAX) packet Take 17 g by mouth daily as needed.    . Prenatal Vit-Fe Fumarate-FA (PRENATAL MULTIVITAMIN) TABS tablet Take 1 tablet by mouth daily at 12 noon.      Musculoskeletal: Strength & Muscle Tone: within normal limits Gait & Station: normal Patient leans: N/A  Psychiatric Specialty Exam: Physical Exam  Review of Systems  Unable to perform  ROS: mental acuity  Constitutional: Negative.   HENT: Negative.   Eyes: Negative.   Respiratory: Negative.   Cardiovascular: Negative.   Gastrointestinal: Negative.   Genitourinary: Negative.   Musculoskeletal: Negative.   Skin: Negative.   Neurological: Negative.   Endo/Heme/Allergies: Negative.   Psychiatric/Behavioral: Positive for depression, hallucinations and substance  abuse. The patient is nervous/anxious and has insomnia.   All other systems reviewed and are negative.   Blood pressure 149/62, pulse 102, temperature 98.9 F (37.2 C), temperature source Oral, resp. rate 18, SpO2 100 %, not currently breastfeeding.There is no weight on file to calculate BMI.  General Appearance: Casual and Disheveled  Eye Contact::  Poor  Speech:  Slow  Volume:  incoherent  Mood:  Anxious and Irritable  Affect:  Congruent  Thought Process:  Disorganized, Linear and Tangential  Orientation:  Other:  unable to obtain  Thought Content:  Hallucinations: Auditory Visual  Suicidal Thoughts:  unable to obtain  Homicidal Thoughts:  unable to obtain  Memory:  unable to obtain  Judgement:  Poor  Insight:  Shallow  Psychomotor Activity:  Restlessness  Concentration:  Poor  Recall:  NA  Fund of Knowledge:Poor  Language: Poor  Akathisia:  NA  Handed:  Right  AIMS (if indicated):     Assets:  Desire for Improvement  ADL's:  Impaired  Cognition: Impaired,  Severe  Sleep:      Medical Decision Making: Review of Psycho-Social Stressors (1), Established Problem, Worsening (2), Review of Medication Regimen & Side Effects (2) and Review of New Medication or Change in Dosage (2)  Treatment Plan:   Schizoaffective disorder, unstable, warrants inpatient admission; continue plan as follows:   Medication management:  -Continue Symmetrel 100mg  PO bid for mood stabilization -Continue Ativan 1mg  PO q8h prn anxiety/agitation -Continue Lithium carbonate 300mg  PO q12h for bipolar stabilization -continue  Risperidone 1mg  PO bid for psychosis -Discontinue Ambien (psychotic) -Start Trazodone 50mg  PO qhs prn insomnia  Disposition:  -Inpatient psychiatric hospitalization for safety and stabilization  Beau Fanny, FNP-BC  04/19/2015 5:18 PM  Patient seen face-to-face for this evaluation and case discussed with physician extender and made appropriate treatment plan. Reviewed the information documented and agree with the treatment plan.  Lanelle Lindo,JANARDHAHA R. 04/20/2015 2:03 PM

## 2015-04-19 NOTE — ED Notes (Signed)
Got report from [place]. Pt at this time is in bed sleeping. Safe environment provided, 15 minutes safety checks continues.

## 2015-04-19 NOTE — ED Notes (Signed)
Report received from [facility]. Pt. in no distress denies SI, HI, AVH and pain.  Pt. Instructed to come to me with problems or concerns.Will continue to monitor for safety via security cameras and Q 15 minute checks.

## 2015-04-20 DIAGNOSIS — F259 Schizoaffective disorder, unspecified: Secondary | ICD-10-CM | POA: Diagnosis not present

## 2015-04-20 NOTE — Consult Note (Signed)
[facility] Face-to-Face Psychiatry Consult   Reason for Consult:  Schizoaffective disorder Referring Physician:  EDP Patient Identification: Cassidy Thomas MRN:  [id number] Principal Diagnosis: Schizoaffective disorder  Diagnosis:   Patient Active Problem List   Diagnosis Date Noted  . 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 Schizoaffective disorder [F25.9] 01/14/2014  . Mental retardation [F79] 01/14/2014  . Schizophrenia [F20.9] 05/31/2013  . AMA (advanced maternal age) multigravida 35+ [O09.529] 05/31/2013    Total Time spent with patient: 25 minutes  Subjective:   Cassidy Thomas is a 39 y.o. female admitted with Schizoaffective disorder. Patient seen face-to-face for this evaluation during morning rounds along with Dahlia Byes, NP and case discussed with the treatment team. Patient provided phone contact numbers of her grandfather and uncle for collateral information. Patient denied disturbance of sleep and appetite and seems to be compliant with the staff members regarding medication management. Patient minimizes her safety concerns today were continued to be tangential, disorganized, and psychotic. Pt continues to meet inpatient criteria and we are seeking placement.   HPI: AA female, 39 years was evaluated for confusion and disorganized thinking.  Patient was IVC by her grandfather for not taking her medications for Bipolar and Schizophrenia.  Patient, this morning could not participate in the interview and could not explain the purpose for this admission.  Patient was tangential and did not answer any question correctly.  Her UDS is positive for Cocaine and Marijuana.  Patient has been accepted for admission and we will be seeking bed placement at any facility with available beds.  HPI Elements:   Location:  Schizoaffective disorder, Schizophrenia. Quality:  severe. Severity:  severe. Timing:  acute. Duration:  Chronic mental illness. Context:  IVC by her grandfather for  not taking medications and having hallucinations..  Past Medical History:  Past Medical History  Diagnosis Date  . Bipolar 1 disorder   . Schizophrenia   . Asthma   . Substance abuse     Past Surgical History  Procedure Laterality Date  . Breast lumpectomy  2012    right breast   Family History:  Family History  Problem Relation Age of Onset  . Cancer Maternal Grandmother    Social History:  History  Alcohol Use No     History  Drug Use  . Yes  . Special: Cocaine, "Crack" cocaine    Comment: psdt histoyr. last use of crack cocaine was last month ago.     History   Social History  . Marital Status: Single    Spouse Name: N/A  . Number of Children: N/A  . Years of Education: N/A   Social History Main Topics  . Smoking status: Current Some Day Smoker -- 0.25 packs/day  . Smokeless tobacco: Never Used  . Alcohol Use: No  . Drug Use: Yes    Special: Cocaine, "Crack" cocaine     Comment: psdt histoyr. last use of crack cocaine was last month ago.   Marland Kitchen Sexual Activity: No   Other Topics Concern  . None   Social History Narrative   Additional Social History:                          Allergies:   Allergies  Allergen Reactions  . Other Other (See Comments)    Patient is allergic to eye drops causes eyes to swell    Labs:  No results found for this or any previous visit (from the past 48 hour(s)).  Vitals: Blood pressure 104/61, pulse 71, temperature 98.2 F (36.8 C), temperature source Oral, resp. rate 18, SpO2 100 %, not currently breastfeeding.  Risk to Self: Is patient at risk for suicide?: No, but patient needs Medical Clearance (see triage note. Pt IVC, psychotic, talking about killing EMS, hostile, trying to kick, bite, and hit staff. ) Risk to Others:   Prior Inpatient Therapy:   Prior Outpatient Therapy:    Current Facility-Administered Medications  Medication Dose Route Frequency Provider Last Rate Last Dose  . acetaminophen (TYLENOL)  tablet 650 mg  650 mg Oral Q4H PRN Elson Areas, PA-C      . amantadine (SYMMETREL) capsule 100 mg  100 mg Oral BID Mojeed Akintayo   100 mg at 04/20/15 0836  . lithium carbonate (LITHOBID) CR tablet 300 mg  300 mg Oral Q12H Mojeed Akintayo   300 mg at 04/20/15 0836  . LORazepam (ATIVAN) tablet 1 mg  1 mg Oral Q8H PRN Elson Areas, PA-C   1 mg at 04/19/15 1350  . ondansetron (ZOFRAN) tablet 4 mg  4 mg Oral Q8H PRN Elson Areas, PA-C      . risperiDONE (RISPERDAL) tablet 1 mg  1 mg Oral BID Mojeed Akintayo   1 mg at 04/20/15 0836  . traZODone (DESYREL) tablet 50 mg  50 mg Oral QHS PRN,MR X 1 Beau Fanny, FNP       Current Outpatient Prescriptions  Medication Sig Dispense Refill  . lithium carbonate 150 MG capsule Take 150-300 mg by mouth 2 (two) times daily with a meal. Take 1 capsule (150 mg) in the am and Take 2 capsules (300 mg) in the pm.    . risperiDONE (RISPERDAL) 1 MG tablet Take 1 mg by mouth at bedtime.     Marland Kitchen ibuprofen (ADVIL,MOTRIN) 600 MG tablet Take 1 tablet (600 mg total) by mouth every 6 (six) hours. 30 tablet 0  . polyethylene glycol (MIRALAX / GLYCOLAX) packet Take 17 g by mouth daily as needed.    . Prenatal Vit-Fe Fumarate-FA (PRENATAL MULTIVITAMIN) TABS tablet Take 1 tablet by mouth daily at 12 noon.      Musculoskeletal: Strength & Muscle Tone: within normal limits Gait & Station: normal Patient leans: N/A  Psychiatric Specialty Exam: Physical Exam  Review of Systems  Unable to perform ROS: mental acuity  Constitutional: Negative.   HENT: Negative.   Eyes: Negative.   Respiratory: Negative.   Cardiovascular: Negative.   Gastrointestinal: Negative.   Genitourinary: Negative.   Musculoskeletal: Negative.   Skin: Negative.   Neurological: Negative.   Endo/Heme/Allergies: Negative.   Psychiatric/Behavioral: Positive for depression, hallucinations and substance abuse. The patient is nervous/anxious and has insomnia.   All other systems reviewed and are  negative.   Blood pressure 104/61, pulse 71, temperature 98.2 F (36.8 C), temperature source Oral, resp. rate 18, SpO2 100 %, not currently breastfeeding.There is no weight on file to calculate BMI.  General Appearance: Casual and Disheveled  Eye Contact::  Poor  Speech:  Slow  Volume:  incoherent  Mood:  Anxious and Irritable  Affect:  Congruent  Thought Process:  Disorganized, Linear and Tangential  Orientation:  Other:  unable to obtain  Thought Content:  Hallucinations: Auditory Visual  Suicidal Thoughts:  unable to obtain  Homicidal Thoughts:  unable to obtain  Memory:  unable to obtain  Judgement:  Poor  Insight:  Shallow  Psychomotor Activity:  Restlessness  Concentration:  Poor  Recall:  NA  Fund of  Knowledge:Poor  Language: Poor  Akathisia:  NA  Handed:  Right  AIMS (if indicated):     Assets:  Desire for Improvement  ADL's:  Impaired  Cognition: Impaired,  Severe  Sleep:      Medical Decision Making: Review of Psycho-Social Stressors (1), Established Problem, Worsening (2), Review of Medication Regimen & Side Effects (2) and Review of New Medication or Change in Dosage (2)  Treatment Plan:   Schizoaffective disorder, unstable, warrants inpatient admission; continue plan as follows:   Medication management:  -Continue Symmetrel  PO bid for mood stabilization -Continue Ativan  PO q8h prn anxiety/agitation -Continue Lithium carbonate  PO q12h for bipolar stabilization and check for therapeutic levels minute appropriate -Continue Risperidone  PO bid for psychosis and monitor for the EPS -Continue Trazodone  PO qhs prn insomnia  Disposition:  -Inpatient psychiatric hospitalization for safety and stabilization  Brookley Spitler,JANARDHAHA R. 04/20/2015 2:22 PM

## 2015-04-20 NOTE — ED Notes (Signed)
Pt. Noted sleeping in room. No complaints or concerns voiced. No distress or abnormal behavior noted. Will continue to monitor with security cameras. Q 15 minute rounds continue. 

## 2015-04-20 NOTE — ED Notes (Signed)
Report received from Tanika RN. Pt. Alert and oriented in no distress denies SI, HI, AVH and pain.  Pt. Instructed to come to me with problems or concerns.Will continue to monitor for safety via security cameras and Q 15 minute checks. 

## 2015-04-20 NOTE — BHH Counselor (Addendum)
Per provider request for collateral information to gather information on patients baseline, counselor contacted patients uncle at [phone] and he requested that counselor contact him at [phone] due to a hearing impairment.  Leonarda Salon stated that the grandfather would be able to provide information on patients baseline. He provided the patients grandfather at [phone] and the phone rang continuously without stopping and no option to leave voicemail. Counselor will try to call back at a later times today.   Uncle called back and stated that he and the patients grandfather will visit the hospital today.

## 2015-04-21 DIAGNOSIS — F259 Schizoaffective disorder, unspecified: Secondary | ICD-10-CM | POA: Diagnosis not present

## 2015-04-21 MED ORDER — TRAZODONE HCL 50 MG PO TABS: 50.0000 mg | ORAL_TABLET | Freq: Every evening | ORAL | Status: DC | PRN

## 2015-04-21 MED ORDER — AMANTADINE HCL 100 MG PO CAPS: 100.0000 mg | ORAL_CAPSULE | Freq: Two times a day (BID) | ORAL | Status: DC

## 2015-04-21 MED ORDER — RISPERIDONE 1 MG PO TABS: 1.0000 mg | ORAL_TABLET | Freq: Two times a day (BID) | ORAL | Status: DC

## 2015-04-21 MED ORDER — LITHIUM CARBONATE ER 300 MG PO TBCR: 300.0000 mg | EXTENDED_RELEASE_TABLET | Freq: Two times a day (BID) | ORAL | Status: DC

## 2015-04-21 NOTE — BHH Suicide Risk Assessment (Signed)
Suicide Risk Assessment  Discharge Assessment   [facility]   Demographic Factors:  NA  Total Time spent with patient: 30 minutes  Musculoskeletal: Strength & Muscle Tone: within normal limits Gait & Station: normal Patient leans: N/A  Psychiatric Specialty Exam: Physical Exam  Review of Systems  Constitutional: Negative.   HENT: Negative.   Eyes: Negative.   Respiratory: Negative.   Cardiovascular: Negative.   Gastrointestinal: Negative.   Genitourinary: Negative.   Musculoskeletal: Negative.   Skin: Negative.   Neurological: Negative.   Endo/Heme/Allergies: Negative.   Psychiatric/Behavioral: Positive for hallucinations and substance abuse.    Blood pressure 102/79, pulse 100, temperature 98.6 F (37 C), temperature source Oral, resp. rate 16, SpO2 98 %, not currently breastfeeding.There is no weight on file to calculate BMI.  General Appearance: Casual  Eye Contact::  Good  Speech:  Normal Rate  Volume:  Normal  Mood:  Euthymic  Affect:  Congruent  Thought Process:  Coherent  Orientation:  Full (Time, Place, and Person)  Thought Content:  WDL  Suicidal Thoughts:  No  Homicidal Thoughts:  No  Memory:  Immediate;   Good Recent;   Good Remote;   Good  Judgement:  Fair  Insight:  Fair  Psychomotor Activity:  Normal  Concentration:  Good  Recall:  Fair  Fund of Knowledge:Fair  Language: Fair  Akathisia:  No  Handed:  Right  AIMS (if indicated):     Assets:  Housing Leisure Time Physical Health Resilience Social Support  ADL's:  Intact  Cognition: WNL  Sleep:         Has this patient used any form of tobacco in the last 30 days? (Cigarettes, Smokeless Tobacco, Cigars, and/or Pipes) Yes, A prescription for an FDA-approved tobacco cessation medication was offered at discharge and the patient refused  Mental Status Per Nursing Assessment::   On Admission:   Hallucinations  Current Mental Status by Physician: NA  Loss  Factors: NA  Historical Factors: NA  Risk Reduction Factors:   Sense of responsibility to family, Living with another person, especially a relative, Positive social support and Positive therapeutic relationship  Continued Clinical Symptoms:  None  Cognitive Features That Contribute To Risk:  None    Suicide Risk:  Minimal: No identifiable suicidal ideation.  Patients presenting with no risk factors but with morbid ruminations; may be classified as minimal risk based on the severity of the depressive symptoms  Principal Problem: Schizoaffective disorder Discharge Diagnoses:  Patient Active Problem List   Diagnosis Date Noted  . Schizoaffective disorder [F25.9] 01/14/2014    Priority: High  . Mental retardation [F79] 01/14/2014    Priority: High  . AMA (advanced maternal age) multigravida 35+ [O09.529] 05/31/2013    Follow-up Information    Schedule an appointment as soon as possible for a visit with onsite care.   Why:  This is your assigned medicaid [place] access primary care provider (pcp)/family doctor.  If this is not your preferred doctor contact the local DSS [place] DSS is [phone], [place] DSS office [phone]) [phone]   Contact information:   pcp is [facility] [address] [place] [phone]       Plan Of Care/Follow-up recommendations:  Activity:  as tolerated Diet:  heart healthy diet  Is patient on multiple antipsychotic therapies at discharge:  No   Has Patient had three or more failed trials of antipsychotic monotherapy by history:  No  Recommended Plan  for Multiple Antipsychotic Therapies: NA    Kayleeann Huxford, PMH-NP 04/21/2015, 1:41 PM

## 2015-04-21 NOTE — ED Notes (Signed)
Pt. Noted sleeping in room. No complaints or concerns voiced. No distress or abnormal behavior noted. Will continue to monitor with security cameras. Q 15 minute rounds continue. 

## 2015-04-21 NOTE — BH Assessment (Signed)
BHH Assessment Progress Note  Per Thedore Mins, MD this pt does not require psychiatric hospitalization at this time.  She is to be released from [facility] and discharged from [facility] with outpatient referrals.  IVC has been rescinded.  Discharge instructions include referral information for [facility].  Pt's nurse, Dawnaly, has been notified.  Doylene Canning, MA Triage Specialist [phone]

## 2015-04-21 NOTE — Discharge Instructions (Signed)
For your ongoing mental health needs, you are advised to follow up with [facility].  New and returning patients are seen at their walk-in clinic.  Walk-in hours are Monday - Friday from 8:00 am - 3:00 pm.  Walk-in patients are seen on a first come, first served basis.  Try to arrive as early as possible for he best chance of being seen the same day: ° °     [facility] °     [address] °     [place], [place] [address] °     [phone] °

## 2015-04-21 NOTE — Consult Note (Signed)
[facility] Face-to-Face Psychiatry Consult   Reason for Consult:  Hallucinations Referring Physician:  ED{ Patient Identification: Cassidy Thomas MRN:  [id number] Principal Diagnosis: Schizoaffective disorder Diagnosis:   Patient Active Problem List   Diagnosis Date Noted  . Schizoaffective disorder [F25.9] 01/14/2014    Priority: High  . Mental retardation [F79] 01/14/2014    Priority: High  . AMA (advanced maternal age) multigravida 35+ [O09.529] 05/31/2013    Total Time spent with patient: 30 minutes  Subjective:   Cassidy Thomas is a 39 y.o. female patient has stabilized.  HPI:  The patient denies hallucinations today along with suicidal ideations and homicidal ideations.  She has stabilized on her medications.  Cassidy Thomas was encouraged to go to [facility] for follow-up and refrain from using drugs.  HPI Elements:   Location:  generalized. Quality:  acute. Severity:  moderate/severe. Timing:  constant . Duration:  few days. Context:  stressors.  Past Medical History:  Past Medical History  Diagnosis Date  . Bipolar 1 disorder   . Schizophrenia   . Asthma   . Substance abuse     Past Surgical History  Procedure Laterality Date  . Breast lumpectomy  2012    right breast   Family History:  Family History  Problem Relation Age of Onset  . Cancer Maternal Grandmother    Social History:  History  Alcohol Use No     History  Drug Use  . Yes  . Special: Cocaine, "Crack" cocaine    Comment: psdt histoyr. last use of crack cocaine was last month ago.     History   Social History  . Marital Status: Single    Spouse Name: N/A  . Number of Children: N/A  . Years of Education: N/A   Social History Main Topics  . Smoking status: Current Some Day Smoker -- 0.25 packs/day  . Smokeless tobacco: Never Used  . Alcohol Use: No  . Drug Use: Yes    Special: Cocaine, "Crack" cocaine     Comment: psdt histoyr. last use of crack cocaine was last month ago.   Marland Kitchen Sexual Activity:  No   Other Topics Concern  . None   Social History Narrative   Additional Social History:                          Allergies:   Allergies  Allergen Reactions  . Other Other (See Comments)    Patient is allergic to eye drops causes eyes to swell    Labs: No results found for this or any previous visit (from the past 48 hour(s)).  Vitals: Blood pressure 102/79, pulse 100, temperature 98.6 F (37 C), temperature source Oral, resp. rate 16, SpO2 98 %, not currently breastfeeding.  Risk to Self: Is patient at risk for suicide?: No, but patient needs Medical Clearance (see triage note. Pt IVC, psychotic, talking about killing EMS, hostile, trying to kick, bite, and hit staff. ) Risk to Others:   Prior Inpatient Therapy:   Prior Outpatient Therapy:    Current Facility-Administered Medications  Medication Dose Route Frequency Provider Last Rate Last Dose  . acetaminophen (TYLENOL) tablet 650 mg  650 mg Oral Q4H PRN Elson Areas, PA-C      . amantadine (SYMMETREL) capsule 100 mg  100 mg Oral BID Rashea Hoskie   100 mg at 04/21/15 0943  . lithium carbonate (LITHOBID) CR tablet 300 mg  300 mg Oral Q12H Curby Carswell  300 mg at 04/21/15 0943  . LORazepam (ATIVAN) tablet 1 mg  1 mg Oral Q8H PRN Elson Areas, PA-C   1 mg at 04/19/15 1350  . ondansetron (ZOFRAN) tablet 4 mg  4 mg Oral Q8H PRN Elson Areas, PA-C      . risperiDONE (RISPERDAL) tablet 1 mg  1 mg Oral BID Temprance Wyre   1 mg at 04/21/15 0943  . traZODone (DESYREL) tablet 50 mg  50 mg Oral QHS PRN,MR X 1 Beau Fanny, FNP       Current Outpatient Prescriptions  Medication Sig Dispense Refill  . amantadine (SYMMETREL) 100 MG capsule Take 1 capsule (100 mg total) by mouth 2 (two) times daily. 60 capsule 0  . ibuprofen (ADVIL,MOTRIN) 600 MG tablet Take 1 tablet (600 mg total) by mouth every 6 (six) hours. 30 tablet 0  . lithium carbonate (LITHOBID) 300 MG CR tablet Take 1 tablet (300 mg total) by mouth  every 12 (twelve) hours. 60 tablet 0  . polyethylene glycol (MIRALAX / GLYCOLAX) packet Take 17 g by mouth daily as needed.    . Prenatal Vit-Fe Fumarate-FA (PRENATAL MULTIVITAMIN) TABS tablet Take 1 tablet by mouth daily at 12 noon.    . risperiDONE (RISPERDAL) 1 MG tablet Take 1 tablet (1 mg total) by mouth 2 (two) times daily. 60 tablet 0  . traZODone (DESYREL) 50 MG tablet Take 1 tablet (50 mg total) by mouth at bedtime as needed and may repeat dose one time if needed for sleep. 30 tablet 0    Musculoskeletal: Strength & Muscle Tone: within normal limits Gait & Station: normal Patient leans: N/A  Psychiatric Specialty Exam: Physical Exam  Review of Systems  Constitutional: Negative.   HENT: Negative.   Eyes: Negative.   Respiratory: Negative.   Cardiovascular: Negative.   Gastrointestinal: Negative.   Genitourinary: Negative.   Musculoskeletal: Negative.   Skin: Negative.   Neurological: Negative.   Endo/Heme/Allergies: Negative.   Psychiatric/Behavioral: Positive for hallucinations and substance abuse.    Blood pressure 102/79, pulse 100, temperature 98.6 F (37 C), temperature source Oral, resp. rate 16, SpO2 98 %, not currently breastfeeding.There is no weight on file to calculate BMI.  General Appearance: Casual  Eye Contact::  Good  Speech:  Normal Rate  Volume:  Normal  Mood:  Euthymic  Affect:  Congruent  Thought Process:  Coherent  Orientation:  Full (Time, Place, and Person)  Thought Content:  WDL  Suicidal Thoughts:  No  Homicidal Thoughts:  No  Memory:  Immediate;   Good Recent;   Good Remote;   Good  Judgement:  Fair  Insight:  Fair  Psychomotor Activity:  Normal  Concentration:  Good  Recall:  Fair  Fund of Knowledge:Fair  Language: Fair  Akathisia:  No  Handed:  Right  AIMS (if indicated):     Assets:  Housing Leisure Time Physical Health Resilience Social Support  ADL's:  Intact  Cognition: WNL  Sleep:      Medical Decision Making:  Review of Psycho-Social Stressors (1), Review or order clinical lab tests (1) and Review of Medication Regimen & Side Effects (2)  Treatment Plan Summary: Daily contact with patient to assess and evaluate symptoms and progress in treatment, Medication management and Plan discharge home with Rx and follow-up with [facility]  Plan:  No evidence of imminent risk to self or others at present.   Disposition: discharge home with Rx and follow-up with Nolene Ebbs, PMH-NP 04/21/2015 1:32  PM Patient seen face-to-face for psychiatric evaluation, chart reviewed and case discussed with the physician extender and developed treatment plan. Reviewed the information documented and agree with the treatment plan. Corena Pilgrim, MD

## 2015-04-21 NOTE — ED Notes (Signed)
Patient discharged to home.  All belongings returned and signed for.  Patient denies thoughts of harm to self or others.  She also denies auditory or visual hallucinations.  She was given a bus pass and resources for follow up care.  She left the unit ambulatory and was escorted to the lobby.

## 2015-04-25 ENCOUNTER — Encounter (HOSPITAL_COMMUNITY): Payer: Self-pay | Admitting: *Deleted

## 2015-05-06 ENCOUNTER — Encounter (HOSPITAL_COMMUNITY): Payer: Self-pay | Admitting: Emergency Medicine

## 2015-05-06 ENCOUNTER — Emergency Department (HOSPITAL_COMMUNITY)
Admission: EM | Admit: 2015-05-06 | Discharge: 2015-05-06 | Discharge status: Against Medical Advice | Payer: Medicare Other | Attending: Emergency Medicine | Admitting: Emergency Medicine

## 2015-05-06 DIAGNOSIS — F1994 Other psychoactive substance use, unspecified with psychoactive substance-induced mood disorder: Secondary | ICD-10-CM

## 2015-05-06 DIAGNOSIS — F209 Schizophrenia, unspecified: Secondary | ICD-10-CM | POA: Diagnosis not present

## 2015-05-06 DIAGNOSIS — Z720 Tobacco use: Secondary | ICD-10-CM | POA: Insufficient documentation

## 2015-05-06 DIAGNOSIS — Z008 Encounter for other general examination: Secondary | ICD-10-CM | POA: Diagnosis present

## 2015-05-06 DIAGNOSIS — J45909 Unspecified asthma, uncomplicated: Secondary | ICD-10-CM | POA: Insufficient documentation

## 2015-05-06 DIAGNOSIS — Z79899 Other long term (current) drug therapy: Secondary | ICD-10-CM | POA: Insufficient documentation

## 2015-05-06 DIAGNOSIS — F329 Major depressive disorder, single episode, unspecified: Secondary | ICD-10-CM | POA: Insufficient documentation

## 2015-05-06 DIAGNOSIS — F1914 Other psychoactive substance abuse with psychoactive substance-induced mood disorder: Secondary | ICD-10-CM | POA: Diagnosis not present

## 2015-05-06 LAB — CBC
HCT: 37.5 % (ref 36.0–46.0)
Hemoglobin: 12.5 g/dL (ref 12.0–15.0)
MCH: 29.1 pg (ref 26.0–34.0)
MCHC: 33.3 g/dL (ref 30.0–36.0)
MCV: 87.4 fL (ref 78.0–100.0)
PLATELETS: 215 10*3/uL (ref 150–400)
RBC: 4.29 MIL/uL (ref 3.87–5.11)
RDW: 13.2 % (ref 11.5–15.5)
WBC: 4.8 10*3/uL (ref 4.0–10.5)

## 2015-05-06 LAB — COMPREHENSIVE METABOLIC PANEL
ALK PHOS: 145 U/L — AB (ref 38–126)
ALT: 8 U/L — ABNORMAL LOW (ref 14–54)
AST: 17 U/L (ref 15–41)
Albumin: 3.9 g/dL (ref 3.5–5.0)
Anion gap: 6 (ref 5–15)
BUN: 9 mg/dL (ref 6–20)
CO2: 26 mmol/L (ref 22–32)
Calcium: 9 mg/dL (ref 8.9–10.3)
Chloride: 110 mmol/L (ref 101–111)
Creatinine, Ser: 0.72 mg/dL (ref 0.44–1.00)
GFR calc Af Amer: >60 mL/min (ref >60)
GFR calc non Af Amer: >60 mL/min (ref >60)
Glucose, Bld: 105 mg/dL — ABNORMAL HIGH (ref 65–99)
Potassium: 3.5 mmol/L (ref 3.5–5.1)
Sodium: 142 mmol/L (ref 135–145)
TOTAL PROTEIN: 6.7 g/dL (ref 6.5–8.1)
Total Bilirubin: 0.4 mg/dL (ref 0.3–1.2)

## 2015-05-06 LAB — LITHIUM LEVEL

## 2015-05-06 LAB — ETHANOL

## 2015-05-06 MED ORDER — RISPERIDONE 1 MG PO TABS
1.0000 mg | ORAL_TABLET | Freq: Two times a day (BID) | ORAL | Status: DC
Start: 2015-05-06 — End: 2015-05-06

## 2015-05-06 MED ORDER — TRAZODONE HCL 50 MG PO TABS: 50.0000 mg | ORAL_TABLET | Freq: Every evening | ORAL | Status: DC | PRN

## 2015-05-06 MED ORDER — LITHIUM CARBONATE ER 300 MG PO TBCR
300.0000 mg | EXTENDED_RELEASE_TABLET | Freq: Two times a day (BID) | ORAL | Status: DC
  Filled 2015-05-06: qty 1

## 2015-05-06 NOTE — BH Assessment (Signed)
Assessment Note  Cassidy Thomas is an 39 y.o. female with history of Bipolar I Disorder and Schizophrenia. Sts that she was brought to [facility] by her paternal grandfather. Patient reporting that she is here to get help with her guardianship. She explains that she had a guardian in the past but not currently. She denies SI, HI, and AVH's. Sts that she was hospitalized at [facility] many times in the past. Patient unable to provide a reason for her hospitalizations. Patient denies alcohol and drug use. Patient was not forthcoming with any further information. Sts that she lives with her uncle. She provided his contact information Cassidy Thomas [phone].  Writer contacted patient's grandfather Cassidy Thomas for collateral information. He explains that patient lives on the street and sometimes with her boyfriend. The grandfather sts that patient had her initial psychotic break at the age of 52. Sts that he was told patient had been seen on the street "half naked". He reports that patient smokes crack with her boyfriend. Sts that the boyfriend will let her come stay with him for a few days and then kicks her out. Patient has also lost almost 100 pounds and doesn't groom herself. The grandfather is not aware of patient having a legal guardian nor a history of guardianship. The grandfather stated that he did not want any further ties with patient. He asked that staff not call him regarding  Cassidy Thomas. Sts that patient has ruined relationships with the entire family.   Axis I: Bipolar 1 Disorder and Schizophrenia Axis II: Deferred Axis III:  Past Medical History  Diagnosis Date  . Bipolar 1 disorder   . Schizophrenia   . Asthma   . Substance abuse    Axis IV: economic problems, educational problems, housing problems, occupational problems, other psychosocial or environmental problems, problems related to legal system/crime, problems related to social environment, problems with access to health care services and  problems with primary support group Axis V: 31-40 impairment in reality testing  Past Medical History:  Past Medical History  Diagnosis Date  . Bipolar 1 disorder   . Schizophrenia   . Asthma   . Substance abuse     Past Surgical History  Procedure Laterality Date  . Breast lumpectomy  2012    right breast    Family History:  Family History  Problem Relation Age of Onset  . Cancer Maternal Grandmother     Social History:  reports that she has been smoking.  She has never used smokeless tobacco. She reports that she uses illicit drugs (Cocaine and "Crack" cocaine). She reports that she does not drink alcohol.  Additional Social History:  Alcohol / Drug Use Pain Medications: SEE MAR Prescriptions: SEE MAR Over the Counter: SEE MAr History of alcohol / drug use?: No history of alcohol / drug abuse (Pt denies, per prior notes has a hx of cocaine use)  CIWA: CIWA-Ar BP: 127/70 mmHg Pulse Rate: 69 COWS:    Allergies:  Allergies  Allergen Reactions  . Albuterol Sulfate Other (See Comments)    Pt states it makes her have a "bad" feeling.   . Other Other (See Comments)    Patient is allergic to eye drops causes eyes to swell    Home Medications:  (Not in a hospital admission)  OB/GYN Status:  No LMP recorded.  General Assessment Data Location of Assessment: [facility] TTS Assessment: In system Is this a Tele or Face-to-Face Assessment?: Face-to-Face Is this an Initial Assessment or a Re-assessment for  this encounter?: Initial Assessment Marital status: Single Maiden name:  Cassidy Thomas(Cassidy Thomas) Is patient pregnant?: No Pregnancy Status: No Living Arrangements: Other (Comment), Spouse/significant other (homeles or living with boyfriend, per grandfather ) Can pt return to current living arrangement?: Yes Admission Status: Voluntary Is patient capable of signing voluntary admission?: Yes Referral Source: Self/Family/Friend Insurance type:  (MCR/MCD)  Medical Screening Exam  [facility] Walk-in ONLY) Medical Exam completed: No  Crisis Care Plan Living Arrangements: Other (Comment), Spouse/significant other (homeles or living with boyfriend, per grandfather ) Name of Psychiatrist:  Museum/gallery curator([facility] ) Name of Therapist: Unable to get answer from patient  Education Status Is patient currently in school?: No Current Grade:  (n/a) Highest grade of school patient has completed:  (GED) Name of school:  (n/a)  Risk to self with the past 6 months Suicidal Ideation: No Has patient been a risk to self within the past 6 months prior to admission? : No Suicidal Intent: No Has patient had any suicidal intent within the past 6 months prior to admission? : No Is patient at risk for suicide?: No Suicidal Plan?: No Has patient had any suicidal plan within the past 6 months prior to admission? : No Access to Means: No What has been your use of drugs/alcohol within the last 12 months?:  (n/a) Previous Attempts/Gestures: No How many times?:  (patient denies ) Other Self Harm Risks:  (n/a) Triggers for Past Attempts: Unknown, Unpredictable Intentional Self Injurious Behavior: None Family Suicide History: No Persecutory voices/beliefs?: No Depression: No Substance abuse history and/or treatment for substance abuse?: No Suicide prevention information given to non-admitted patients: Not applicable  Risk to Others within the past 6 months Homicidal Ideation: No Does patient have any lifetime risk of violence toward others beyond the six months prior to admission? : No Thoughts of Harm to Others: No Current Homicidal Intent: No Current Homicidal Plan: No Access to Homicidal Means: No Identified Victim:  (n/a) History of harm to others?: No Assessment of Violence: None Noted Violent Behavior Description:  (patient calm and cooperative ) Does patient have access to weapons?: No Criminal Charges Pending?: No Does patient have a court date: No Is patient on probation?:  No  Psychosis Hallucinations: None noted Delusions: None noted  Mental Status Report Appearance/Hygiene: Disheveled, Body odor, In scrubs Eye Contact: Poor Motor Activity: Freedom of movement Speech: Incoherent, Soft Level of Consciousness: Alert Mood: Preoccupied Affect: Flat Anxiety Level: None Thought Processes: Coherent, Relevant Judgement: Impaired Orientation: Unable to assess Obsessive Compulsive Thoughts/Behaviors: None  Cognitive Functioning Concentration: Decreased Memory: Recent Intact, Remote Intact IQ: Average Insight: Fair Impulse Control: Good Appetite: Fair Weight Loss:  (none reported ) Weight Gain:  (none reported) Sleep: Decreased Total Hours of Sleep:  (varies ) Vegetative Symptoms: None  ADLScreening [facility] Assessment Services) Patient's cognitive ability adequate to safely complete daily activities?: Yes Patient able to express need for assistance with ADLs?: Yes Independently performs ADLs?: Yes (appropriate for developmental age)  Prior Inpatient Therapy Prior Inpatient Therapy: Yes Prior Therapy Dates: Numerous [facility] Reason for Treatment: schizophrenia  Prior Outpatient Therapy Prior Outpatient Therapy: Yes Prior Therapy Dates: Current? Prior Therapy Facilty/Provider(s): [facility]? Reason for Treatment: med management Does patient have an ACCT team?: No Does patient have Intensive In-House Services?  : No Does patient have [facility] services? : Yes Does patient have P4CC services?: No  ADL Screening (condition at time of admission) Patient's cognitive ability adequate to safely complete daily activities?: Yes Patient able to express need for  assistance with ADLs?: Yes Independently performs ADLs?: Yes (appropriate for developmental age)       Abuse/Neglect Assessment (Assessment to be complete while patient is alone) Physical Abuse: Denies Verbal Abuse: Denies Sexual Abuse:  Denies Exploitation of patient/patient's resources: Denies Self-Neglect: Denies Values / Beliefs Cultural Requests During Hospitalization: None Spiritual Requests During Hospitalization: None   Advance Directives (For Healthcare) Does patient have an advance directive?: No          Disposition:  Disposition Initial Assessment Completed for this Encounter: Yes Disposition of Patient: Other dispositions Julieanne Cotton, NP recommends overnight observation and LCSW f/u)  On Site Evaluation by:   Reviewed with Physician:    Melynda Ripple [facility] 05/06/2015 3:54 PM

## 2015-05-06 NOTE — ED Notes (Signed)
Pt states that she has been taking her psych meds as she should but "she doesn't feel any better".  When asked what this means, pt states "I still get infuriated sometimes".  Pt denies SI/HI.  Denies substance abuse.

## 2015-05-06 NOTE — ED Notes (Signed)
Patient is not in bed staff stated that she left

## 2015-05-06 NOTE — ED Notes (Signed)
Pt just left [facility] from facility earlier today.  Pt is talking incoherently in the lobby about some man and a woman and what they would think of her and her decisions.  When she checked in earlier, she stated that she needed to get her rx rearranged because she was not feeling better on her psych meds.  No SI/HI.

## 2015-05-06 NOTE — ED Notes (Signed)
Pt left AMA because she wanted to smoke a cigarette. Informed she will have to restart the process if she left. Pt verbalized understanding.

## 2015-05-06 NOTE — ED Provider Notes (Signed)
CSN: [id number]     Arrival date & time 05/06/15  1326 History   First MD Initiated Contact with Patient 05/06/15 1339     Chief Complaint  Patient presents with  . Medical Clearance     (Consider location/radiation/quality/duration/timing/severity/associated sxs/prior Treatment) The history is provided by the patient. The history is limited by the condition of the patient.  Pt with hx bipolar disorder, and schizophrenia, c/o 'not feeling right in her head' for 'awhile'.  Pt very limited historian due to mental illness - level 5 caveat. Pt rambles from one unrelated thought to next, thoughts of unclear content. States she goes to [place] every day for 7 years, and later says she hasnt gone to [place] for long time.  Patient indicates gets angry at times, at other times sad.  Denies specific physical complaint.  Pt unable to provide additional history.      Past Medical History  Diagnosis Date  . Bipolar 1 disorder   . Schizophrenia   . Asthma   . Substance abuse    Past Surgical History  Procedure Laterality Date  . Breast lumpectomy  2012    right breast   Family History  Problem Relation Age of Onset  . Cancer Maternal Grandmother    History  Substance Use Topics  . Smoking status: Current Some Day Smoker -- 0.25 packs/day  . Smokeless tobacco: Never Used  . Alcohol Use: No   OB History    Gravida Para Term Preterm AB TAB SAB Ectopic Multiple Living   0 0 0 0 0  2       Review of Systems  Unable to perform ROS: Psychiatric disorder  Constitutional: Negative for fever.  Neurological: Negative for headaches.  level 5 caveat - psych illness    Allergies  Other  Home Medications   Prior to Admission medications   Medication Sig Start Date End Date Taking? Authorizing Provider  amantadine (SYMMETREL) 100 MG capsule Take 1 capsule (100 mg total) by mouth 2 (two) times daily. 04/21/15   Charm Rings, NP  ibuprofen (ADVIL,MOTRIN) 600 MG tablet Take 1  tablet (600 mg total) by mouth every 6 (six) hours. 12/13/13   Arabella Merles, CNM  lithium carbonate (LITHOBID) 300 MG CR tablet Take 1 tablet (300 mg total) by mouth every 12 (twelve) hours. 04/21/15   Charm Rings, NP  polyethylene glycol (MIRALAX / Ethelene Hal) packet Take 17 g by mouth daily as needed.    Historical Provider, MD  Prenatal Vit-Fe Fumarate-FA (PRENATAL MULTIVITAMIN) TABS tablet Take 1 tablet by mouth daily at 12 noon.    Historical Provider, MD  risperiDONE (RISPERDAL) 1 MG tablet Take 1 tablet (1 mg total) by mouth 2 (two) times daily. 04/21/15   Charm Rings, NP  traZODone (DESYREL) 50 MG tablet Take 1 tablet (50 mg total) by mouth at bedtime as needed and may repeat dose one time if needed for sleep. 04/21/15   Charm Rings, NP   BP 127/70 mmHg  Pulse 69  Temp(Src) 98 F (36.7 C) (Oral)  SpO2 100% Physical Exam  Constitutional: She appears well-developed and well-nourished. No distress.  HENT:  Head: Atraumatic.  Mouth/Throat: Oropharynx is clear and moist.  Eyes: Conjunctivae are normal. Pupils are equal, round, and reactive to light. No scleral icterus.  Neck: Neck supple. No tracheal deviation present.  Cardiovascular: Normal rate, regular rhythm, normal heart sounds and intact distal pulses.   Pulmonary/Chest: Effort normal and breath sounds  normal. No respiratory distress.  Abdominal: Soft. Normal appearance and bowel sounds are normal. She exhibits no distension. There is no tenderness.  Genitourinary:  No cva tenderness  Musculoskeletal: She exhibits no edema or tenderness.  Neurological: She is alert.  Awake and alert. Speech quiet, and content disjointed. Ambulates w steady gait. Equal grips.   Skin: Skin is warm and dry. No rash noted. She is not diaphoretic.  Psychiatric:  Alert, flat affect. Pt difficult to assess. Limited response. Pt w poor insight, moves from one unrelated thought to next. Denies SI.  Nursing note and vitals reviewed.   ED Course   Procedures (including critical care time) Labs Review      MDM   Labs sent.  [facility]   Reviewed nursing notes and prior charts for additional history.   Recheck, pt alert, content appearing.    [facility] - suspect pt w decompensated schizophrenia/psychosis, and that pt will require inpatient psych placement.   Labs pending.   Dr Rubin PayorPickering to f/u with psych team eval, and dispo appropriately.      Cathren LaineKevin Lolamae Voisin, MD 05/10/15 [phone]

## 2021-02-02 ENCOUNTER — Other Ambulatory Visit: Payer: Self-pay | Admitting: Family Medicine

## 2021-02-02 DIAGNOSIS — Z1231 Encounter for screening mammogram for malignant neoplasm of breast: Secondary | ICD-10-CM

## 2021-07-06 ENCOUNTER — Ambulatory Visit (HOSPITAL_COMMUNITY): Payer: Medicare Other | Admitting: Licensed Clinical Social Worker

## 2021-10-05 ENCOUNTER — Encounter: Payer: Medicare Other | Admitting: Obstetrics and Gynecology

## 2021-12-02 ENCOUNTER — Telehealth: Payer: Self-pay | Admitting: Family Medicine

## 2021-12-02 ENCOUNTER — Encounter: Payer: Self-pay | Admitting: Family Medicine

## 2021-12-02 ENCOUNTER — Ambulatory Visit: Payer: Self-pay | Admitting: Family Medicine

## 2021-12-02 ENCOUNTER — Other Ambulatory Visit: Payer: Self-pay

## 2021-12-02 DIAGNOSIS — Z113 Encounter for screening for infections with a predominantly sexual mode of transmission: Secondary | ICD-10-CM

## 2021-12-02 LAB — WET PREP FOR TRICH, YEAST, CLUE
Trichomonas Exam: NEGATIVE
Yeast Exam: NEGATIVE

## 2021-12-02 MED ORDER — ACYCLOVIR 400 MG PO TABS: 400.0000 mg | ORAL_TABLET | Freq: Three times a day (TID) | ORAL | Status: DC

## 2021-12-02 NOTE — Progress Notes (Signed)
Patient seen today for STD testing. Wet prep reviewed, no tx per standing orders. Condoms given.   ?

## 2021-12-02 NOTE — Telephone Encounter (Signed)
See phone encounter.

## 2021-12-02 NOTE — Progress Notes (Signed)
[facility] Department  STI clinic/screening visit [address] [place] [address] [phone]  Subjective:  Cassidy Thomas is a 46 y.o. female being seen today for an STI screening visit. The patient reports they do have symptoms.  Patient reports that they do not desire a pregnancy in the next year.   They reported they are not interested in discussing contraception today.    Patient's last menstrual period was 11/09/2021 (approximate).   Patient has the following medical conditions:   Patient Active Problem List   Diagnosis Date Noted   Schizoaffective disorder (HCC) 01/14/2014   Mental retardation 01/14/2014   AMA (advanced maternal age) multigravida 35+ 05/31/2013    Chief Complaint  Patient presents with   SEXUALLY TRANSMITTED DISEASE    screening    HPI  Patient reports  she is having some burning with urination- describes as "discomfort" for about 2-3 days.  She also c/o L inner thigh discomfort.  States last sexual activity was 11/05/2021 without a condom. Client is here with the group home staff.  Last HIV test per patient/review of record was 2015 Patient reports last pap was 2014  Screening for MPX risk: Does the patient have an unexplained rash? No Is the patient MSM? No Does the patient endorse multiple sex partners or anonymous sex partners? No Did the patient have close or sexual contact with a person diagnosed with MPX? No Has the patient traveled outside the [place] where MPX is endemic? No Is there a high clinical suspicion for MPX-- evidenced by one of the following No  -Unlikely to be chickenpox  -Lymphadenopathy  -Rash that present in same phase of evolution on any given body part See flowsheet for further details and programmatic requirements.    The following portions of the patient's history were reviewed and updated as appropriate: allergies, current medications, past medical history, past social history, past surgical  history and problem list.  Objective:  There were no vitals filed for this visit.  Physical Exam Vitals and nursing note reviewed.  Constitutional:      Appearance: Normal appearance.  HENT:     Head: Normocephalic and atraumatic.     Mouth/Throat:     Mouth: Mucous membranes are moist.     Pharynx: Oropharynx is clear. No oropharyngeal exudate or posterior oropharyngeal erythema.  Pulmonary:     Effort: Pulmonary effort is normal.  Abdominal:     General: Abdomen is flat.     Palpations: There is no mass.     Tenderness: There is no abdominal tenderness. There is no rebound.  Genitourinary:    Exam position: Lithotomy position.     Pubic Area: No rash or pubic lice.      Labia:        Right: Tenderness and lesion present.        Left: Tenderness and lesion present.      Rectum: Normal.     Comments: Unable to perform speculum exam due to pain-client deferred exam  Wet prep and GC/NAAT vaginally collected. Labia- numerous ulcerated lesions noted, thick mucous  disch. Noted .  Lymphadenopathy:     Head:     Right side of head: No preauricular or posterior auricular adenopathy.     Left side of head: No preauricular or posterior auricular adenopathy.     Cervical: No cervical adenopathy.     Upper Body:     Right upper body: No supraclavicular or axillary adenopathy.  Left upper body: No supraclavicular or axillary adenopathy.     Lower Body: No right inguinal adenopathy. No left inguinal adenopathy.  Skin:    General: Skin is warm and dry.     Findings: No rash.  Neurological:     Mental Status: She is alert and oriented to person, place, and time.     Assessment and Plan:  Cassidy Thomas is a 46 y.o. female presenting to the [facility] Department for STI screening  1. 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  RX sent to [facility]    No follow-ups on file.  No future appointments.  Larene Pickett, FNP

## 2021-12-03 ENCOUNTER — Other Ambulatory Visit: Payer: Self-pay | Admitting: Family Medicine

## 2021-12-03 DIAGNOSIS — B009 Herpesviral infection, unspecified: Secondary | ICD-10-CM

## 2021-12-03 MED ORDER — ACYCLOVIR 800 MG PO TABS: 400.0000 mg | ORAL_TABLET | Freq: Three times a day (TID) | ORAL | 0 refills | Status: DC

## 2021-12-03 NOTE — Progress Notes (Signed)
RX sent to requested pharmacy.  1. HSV infection - acyclovir (ZOVIRAX) 800 MG tablet; Take 0.5 tablets (400 mg total) by mouth 3 (three) times daily.  Dispense: 30 tablet; Refill: 0    Wendi Snipes, FNP

## 2021-12-03 NOTE — Addendum Note (Signed)
Addended by: Wendi Snipes on: 12/03/2021 03:14 PM   Modules accepted: Orders

## 2022-02-24 ENCOUNTER — Ambulatory Visit (HOSPITAL_COMMUNITY)
Admission: EM | Admit: 2022-02-24 | Discharge: 2022-02-24 | Disposition: A | Discharge status: Home / Self Care | Payer: 59 | Attending: Psychiatry | Admitting: Psychiatry

## 2022-02-24 DIAGNOSIS — F259 Schizoaffective disorder, unspecified: Secondary | ICD-10-CM

## 2022-02-24 DIAGNOSIS — F250 Schizoaffective disorder, bipolar type: Secondary | ICD-10-CM | POA: Diagnosis present

## 2022-02-24 NOTE — ED Provider Notes (Signed)
Behavioral Health Urgent Care Medical Screening Exam ? ?Patient Name: Cassidy Thomas ?MRN: [id number] ?Date of Evaluation: 02/24/22 ?Chief Complaint:   ?Diagnosis:  ?Final diagnoses:  ?Schizoaffective disorder, unspecified type (HCC)  ? ? ?History of Present illness: Cassidy Thomas is a 46 y.o. female. Patient presents to [facility] behavioral health under involuntary commitment petition, transported by Patent examiner.  Petition initiated by Arnetha Courser, legal guardian,  involuntary commitment petition reads:  ?"Respondent has been diagnosed with schizoaffective disorder (bipolar type) and has been formally deemed incompetent.  Respondent is experiencing auditory hallucinations where she believes someone is going to harm her.  She takes injections for her mental health issues but has not received an injection in 3 months.  She was reported missing from her group home by [facility] police and was located at her grandfather's home.  Respondent was found appearing disheveled and had not eaten in 3 days.  She also has a history of eloping from group home and placing herself in dangerous situations, excepting rides from strangers and abusing narcotics." ? ?Patient is assessed, face-to-face, by nurse practitioner.  Patient presents with euthymic mood and congruent affect.  Patient is alert and oriented, pleasant and cooperative during assessment. ? ?Cassidy Thomas reports she has recently resided with her grandfather as she is awaiting placement in her former group home, [facility].  She states "there are a whole lot of girls and one house and they have put another girl in my bed."  Patient reports she would prefer to live in the home of her grandfather as at the group home she "I just sit but I like talking to my friends." ? ?Patient reports she has resided at the home with her grandfather for the past several days, approximately 1 week.  She reports on last night she walked to the store then decided to stay with a  friend who also lives on [address], near the home of her grandfather.  Recent stressors include patient's grandfather who does not permit her to have a boyfriend.  She reports "if my grandfather puts me out of the house I will go to a shelter." ? ?Patient denies suicidal and homicidal ideations.  She denies history of nonsuicidal self-harm, denies history of suicide attempt.  She denies auditory and visual hallucinations at this time. ? ?Cassidy Thomas is a limited historian regarding medications. She reports she is followed by outpatient psychiatry for medication management. No current counselor.  ? ?Patient currently resides in [place] with her grandfather, denies access to weapons.  She is currently not employed.  She endorses recent alcohol use, last alcohol use on last night.  She reports ingesting 1 drink last night.  She reports she typically consumes alcohol approximately 1 to 2 days/week, denies drinking until blackout, denies alcohol-related seizure.  She denies substance use aside from alcohol.  She endorses average sleep and appetite. ? ?Per report Cassidy Thomas has been diagnosed with schizoaffective disorder, she is a limited historian at this time.  She reports she is typically compliant with medications, she is only aware of contraceptive medication.  She is unable to recall names of other medications.  She reports medications are administered by group home staff and she reports compliance with these medications.  She reports her medications remain at the group home as she plans to return to the group home. ? ?Patient offered support and encouragement.  She gives verbal consent to speak with legal guardian, Arnetha Courser number [phone].  Spoke with legal guardian, Arnetha Courser, who  reports patient can go back to group home in [place] as soon as group home sleeping arrangements have been updated, while patient was residing at grandfather's home, a new resident joined the group home, briefly  residing in patient's room. ?Arnetha Courser [facility] [employer] legal guardian reports Cassidy Thomas was reported missing from her group home in [place] on 02/14/2022, she was located at her grandfather's home 3 days later.  Patient can return to her group home in [place] at this time.  She is followed by outpatient psychiatry at [facility] in [place] for medication management, last seen 2 days ago.  She was scheduled for Invega injection on yesterday however she was not at the home of her grandfather when legal guardian arrived to transport her to appointment.  Legal guardian reports she would like to have patient receive Invega injection prior to discharge to group home.  Discussed with legal guardian having patient receive injection at outpatient, [facility], legal guardian verbalized understanding. ? ?Patient and legal guardian are educated and verbalize understanding of mental health resources and other crisis services in the community.  They are instructed to call 911 and present to the nearest emergency room should she experience any suicidal/homicidal ideation, auditory/visual/hallucinations, or detrimental worsening of her mental health condition.   ? ? ? ?Psychiatric Specialty Exam ? ?Presentation  ?General Appearance:Appropriate for Environment; Casual ? ?Eye Contact:Fair ? ?Speech:Clear and Coherent; Normal Rate ? ?Speech Volume:Normal ? ?Handedness:Right ? ? ?Mood and Affect  ?Mood:Euthymic ? ?Affect:Congruent ? ? ?Thought Process  ?Thought Processes:Coherent; Goal Directed ? ?Descriptions of Associations:Intact ? ?Orientation:Partial ? ?Thought Content:Logical; WDL ?   Hallucinations:None ? ?Ideas of Reference:None ? ?Suicidal Thoughts:No ? ?Homicidal Thoughts:No ? ? ?Sensorium  ?Memory:Immediate Fair ? ?Judgment:Intact ? ?Insight:Shallow ? ? ?Executive Functions  ?Concentration:Good ? ?Attention Span:Good ? ?Recall:Good ? ?Fund of  Knowledge:Fair ? ?Language:Fair ? ? ?Psychomotor Activity  ?Psychomotor Activity:Normal ? ? ?Assets  ?Assets:Communication Skills; Housing; Health and safety inspector; Leisure Time; Physical Health; Resilience; Social Support ? ? ?Sleep  ?Sleep:Fair ? ?Number of hours: No data recorded ? ?No data recorded ? ?Physical Exam: ?Physical Exam ?Vitals and nursing note reviewed.  ?Constitutional:   ?   Appearance: Normal appearance. She is well-developed and normal weight.  ?HENT:  ?   Head: Normocephalic and atraumatic.  ?   Nose: Nose normal.  ?Cardiovascular:  ?   Rate and Rhythm: Normal rate.  ?Pulmonary:  ?   Effort: Pulmonary effort is normal.  ?Musculoskeletal:     ?   General: Normal range of motion.  ?   Cervical back: Normal range of motion.  ?Skin: ?   General: Skin is warm and dry.  ?Neurological:  ?   Mental Status: She is alert. Mental status is at baseline.  ?   Motor: Motor function is intact.  ?   Gait: Gait is intact.  ?Psychiatric:     ?   Attention and Perception: Attention and perception normal.     ?   Mood and Affect: Mood and affect normal.     ?   Speech: Speech normal.     ?   Behavior: Behavior normal. Behavior is cooperative.     ?   Thought Content: Thought content normal.     ?   Cognition and Memory: Memory normal.  ? ?Review of Systems  ?Constitutional: Negative.   ?HENT: Negative.    ?Eyes: Negative.   ?Respiratory: Negative.    ?Cardiovascular: Negative.   ?Gastrointestinal: Negative.   ?  Genitourinary: Negative.   ?Musculoskeletal: Negative.   ?Skin: Negative.   ?Neurological: Negative.   ?Endo/Heme/Allergies: Negative.   ?Psychiatric/Behavioral: Negative.    ?Blood pressure (!) 134/98, pulse 82, temperature 98.1 ?F (36.7 ?C), temperature source Oral, resp. rate 18, SpO2 100 %. There is no height or weight on file to calculate BMI. ? ?Musculoskeletal: ?Strength & Muscle Tone: within normal limits ?Gait & Station: normal ?Patient leans: N/A ? ? ?[facility]charge Disposition for Follow  up and Recommendations: ?Based on my evaluation the patient does not appear to have an emergency medical condition and can be discharged with resources and follow up care in outpatient services for Medication Management and Individual The

## 2022-02-24 NOTE — BH Assessment (Signed)
Cassidy Thomas is a 46 year old female presenting to [facility] under IVC. Per IVC "Respondent has been diagnosed with schizoaffective disorder (Bipolar type) and has been formally deemed incompetent. Respondent is experiencing auditory hallucinations where she believes someone is going to harm her. She takes injections for her mental health issues but has not received an injection in 3 months. She was reported missing from her group home by [employer] and was located at her grandfathers home. Respondent was found appearing disheveled and had not eaten in 3 days. She also has a history of eloping from group home and placing herself in dangerous situations. Accepting rides from strangers and abusing narcotics". PT reports she did not necessarily run away form the group home but left about two weeks ago and since living at her grandfather house. Pt reports drinking 40oz of beer today but denies substance use. Pt  denies SI, HI, AVH. Reports dx of schizophrenia and not taking any medications. Pt IVC'd by guardian.    ? ?

## 2022-02-24 NOTE — Discharge Instructions (Addendum)

## 2022-03-15 ENCOUNTER — Telehealth (HOSPITAL_COMMUNITY): Payer: Self-pay | Admitting: *Deleted

## 2022-03-15 NOTE — BH Assessment (Signed)
Care Management - [facility] Follow Up Discharges   Writer made contact with the Administrator (Mrs. Metta Clines) at the Group Home [phone]).  Per Administrator Metta Clines the patient is at the Group Home and she receives outpatient mental health services with her established providers at [facility].

## 2023-01-24 ENCOUNTER — Other Ambulatory Visit: Payer: Self-pay

## 2023-01-24 ENCOUNTER — Emergency Department (HOSPITAL_COMMUNITY)
Admission: EM | Admit: 2023-01-24 | Discharge: 2023-01-24 | Discharge status: Against Medical Advice | Payer: 59 | Attending: Emergency Medicine | Admitting: Emergency Medicine

## 2023-01-24 DIAGNOSIS — Z5321 Procedure and treatment not carried out due to patient leaving prior to being seen by health care provider: Secondary | ICD-10-CM | POA: Diagnosis not present

## 2023-01-24 DIAGNOSIS — R443 Hallucinations, unspecified: Secondary | ICD-10-CM | POA: Insufficient documentation

## 2023-01-24 LAB — BASIC METABOLIC PANEL
Anion gap: 9 (ref 5–15)
BUN: 6 mg/dL (ref 6–20)
CO2: 22 mmol/L (ref 22–32)
Calcium: 8.6 mg/dL — ABNORMAL LOW (ref 8.9–10.3)
Chloride: 104 mmol/L (ref 98–111)
Creatinine, Ser: 0.89 mg/dL (ref 0.44–1.00)
GFR, Estimated: >60 mL/min (ref >60)
Glucose, Bld: 94 mg/dL (ref 70–99)
Potassium: 3.5 mmol/L (ref 3.5–5.1)
Sodium: 135 mmol/L (ref 135–145)

## 2023-01-24 LAB — CBC
HCT: 37.6 % (ref 36.0–46.0)
Hemoglobin: 12.3 g/dL (ref 12.0–15.0)
MCH: 29.7 pg (ref 26.0–34.0)
MCHC: 32.7 g/dL (ref 30.0–36.0)
MCV: 90.8 fL (ref 80.0–100.0)
Platelets: 242 10*3/uL (ref 150–400)
RBC: 4.14 MIL/uL (ref 3.87–5.11)
RDW: 12.3 % (ref 11.5–15.5)
WBC: 6.4 10*3/uL (ref 4.0–10.5)
nRBC: 0 % (ref 0.0–0.2)

## 2023-01-24 LAB — ETHANOL: Alcohol, Ethyl (B): <10 mg/dL (ref <10)

## 2023-01-24 LAB — SALICYLATE LEVEL: Salicylate Lvl: <7 mg/dL — ABNORMAL LOW (ref 7.0–30.0)

## 2023-01-24 LAB — ACETAMINOPHEN LEVEL: Acetaminophen (Tylenol), Serum: <10 ug/mL — ABNORMAL LOW (ref 10–30)

## 2023-01-24 NOTE — ED Triage Notes (Signed)
Patient reports A/V hallucinations this week , denies SI or HI .

## 2023-01-24 NOTE — ED Notes (Signed)
Pt notified staff that they were leaving  

## 2023-01-24 NOTE — ED Provider Triage Note (Signed)
Emergency Medicine Provider Triage Evaluation Note  Cassidy Thomas , a 47 y.o. female  was evaluated in triage.  Pt complains of hallucinations, patient takes [place], history of schizophrenia, mental retardation, reports that she is having hallucinations despite taking all of her medications.  She denies any SI, HI.  Review of Systems  Positive: hallucinations Negative: Si/hi  Physical Exam  There were no vitals taken for this visit. Gen:   Awake, no distress   Resp:  Normal effort  MSK:   Moves extremities without difficulty  Other:    Medical Decision Making  Medically screening exam initiated at 8:23 PM.  Appropriate orders placed.  Cassidy Thomas was informed that the remainder of the evaluation will be completed by another provider, this initial triage assessment does not replace that evaluation, and the importance of remaining in the ED until their evaluation is complete.  Workup initiated in triage    Anselmo Pickler, [place] 01/24/23 2024

## 2023-06-16 ENCOUNTER — Other Ambulatory Visit: Payer: Self-pay | Admitting: Family Medicine

## 2023-06-16 ENCOUNTER — Encounter: Payer: Self-pay | Admitting: Family Medicine

## 2023-06-16 DIAGNOSIS — Z1231 Encounter for screening mammogram for malignant neoplasm of breast: Secondary | ICD-10-CM

## 2023-07-05 ENCOUNTER — Ambulatory Visit
Admission: RE | Admit: 2023-07-05 | Discharge: 2023-07-05 | Disposition: A | Discharge status: Home / Self Care | Payer: 59 | Source: Ambulatory Visit | Attending: Family Medicine | Admitting: Family Medicine

## 2023-07-05 DIAGNOSIS — Z1231 Encounter for screening mammogram for malignant neoplasm of breast: Secondary | ICD-10-CM | POA: Insufficient documentation

## 2023-10-31 ENCOUNTER — Emergency Department: Payer: 59

## 2023-10-31 ENCOUNTER — Other Ambulatory Visit: Payer: Self-pay

## 2023-10-31 ENCOUNTER — Inpatient Hospital Stay
Admission: EM | Admit: 2023-10-31 | Discharge: 2023-11-02 | DRG: 164 | Disposition: A | Discharge status: Home / Self Care | Payer: 59 | Attending: Internal Medicine | Admitting: Internal Medicine

## 2023-10-31 DIAGNOSIS — F17210 Nicotine dependence, cigarettes, uncomplicated: Secondary | ICD-10-CM | POA: Diagnosis present

## 2023-10-31 DIAGNOSIS — E872 Acidosis, unspecified: Secondary | ICD-10-CM | POA: Diagnosis present

## 2023-10-31 DIAGNOSIS — Z6836 Body mass index (BMI) 36.0-36.9, adult: Secondary | ICD-10-CM

## 2023-10-31 DIAGNOSIS — Z79899 Other long term (current) drug therapy: Secondary | ICD-10-CM

## 2023-10-31 DIAGNOSIS — Z7901 Long term (current) use of anticoagulants: Secondary | ICD-10-CM

## 2023-10-31 DIAGNOSIS — Z888 Allergy status to other drugs, medicaments and biological substances status: Secondary | ICD-10-CM

## 2023-10-31 DIAGNOSIS — I2489 Other forms of acute ischemic heart disease: Secondary | ICD-10-CM | POA: Diagnosis present

## 2023-10-31 DIAGNOSIS — E669 Obesity, unspecified: Secondary | ICD-10-CM | POA: Diagnosis present

## 2023-10-31 DIAGNOSIS — I2699 Other pulmonary embolism without acute cor pulmonale: Secondary | ICD-10-CM | POA: Diagnosis present

## 2023-10-31 DIAGNOSIS — I2609 Other pulmonary embolism with acute cor pulmonale: Principal | ICD-10-CM | POA: Diagnosis present

## 2023-10-31 DIAGNOSIS — R072 Precordial pain: Secondary | ICD-10-CM

## 2023-10-31 DIAGNOSIS — F79 Unspecified intellectual disabilities: Secondary | ICD-10-CM | POA: Diagnosis present

## 2023-10-31 DIAGNOSIS — R079 Chest pain, unspecified: Secondary | ICD-10-CM | POA: Diagnosis not present

## 2023-10-31 DIAGNOSIS — F259 Schizoaffective disorder, unspecified: Secondary | ICD-10-CM | POA: Diagnosis present

## 2023-10-31 DIAGNOSIS — R0902 Hypoxemia: Secondary | ICD-10-CM | POA: Diagnosis present

## 2023-10-31 DIAGNOSIS — J45909 Unspecified asthma, uncomplicated: Secondary | ICD-10-CM | POA: Diagnosis present

## 2023-10-31 DIAGNOSIS — F319 Bipolar disorder, unspecified: Secondary | ICD-10-CM | POA: Diagnosis present

## 2023-10-31 LAB — CBC
HCT: 41.2 % (ref 36.0–46.0)
Hemoglobin: 13.7 g/dL (ref 12.0–15.0)
MCH: 29.7 pg (ref 26.0–34.0)
MCHC: 33.3 g/dL (ref 30.0–36.0)
MCV: 89.2 fL (ref 80.0–100.0)
Platelets: 182 10*3/uL (ref 150–400)
RBC: 4.62 MIL/uL (ref 3.87–5.11)
RDW: 12.3 % (ref 11.5–15.5)
WBC: 9.2 10*3/uL (ref 4.0–10.5)
nRBC: 0 % (ref 0.0–0.2)

## 2023-10-31 LAB — D-DIMER, QUANTITATIVE: D-Dimer, Quant: 2.79 ug{FEU}/mL — ABNORMAL HIGH (ref 0.00–0.50)

## 2023-10-31 LAB — BASIC METABOLIC PANEL
Anion gap: 9 (ref 5–15)
BUN: 10 mg/dL (ref 6–20)
CO2: 22 mmol/L (ref 22–32)
Calcium: 9.1 mg/dL (ref 8.9–10.3)
Chloride: 106 mmol/L (ref 98–111)
Creatinine, Ser: 0.94 mg/dL (ref 0.44–1.00)
GFR, Estimated: >60 mL/min (ref >60)
Glucose, Bld: 117 mg/dL — ABNORMAL HIGH (ref 70–99)
Potassium: 3.9 mmol/L (ref 3.5–5.1)
Sodium: 137 mmol/L (ref 135–145)

## 2023-10-31 LAB — PROTIME-INR
INR: 1.1 (ref 0.8–1.2)
Prothrombin Time: 13.9 s (ref 11.4–15.2)

## 2023-10-31 LAB — TROPONIN I (HIGH SENSITIVITY)
Troponin I (High Sensitivity): 195 ng/L (ref <18)
Troponin I (High Sensitivity): 186 ng/L (ref <18)

## 2023-10-31 LAB — APTT: aPTT: 27 s (ref 24–36)

## 2023-10-31 LAB — BRAIN NATRIURETIC PEPTIDE: B Natriuretic Peptide: 209.7 pg/mL — ABNORMAL HIGH (ref 0.0–100.0)

## 2023-10-31 MED ORDER — HEPARIN BOLUS VIA INFUSION
4000.0000 [IU] | Freq: Once | INTRAVENOUS | Status: AC
  Administered 2023-10-31: 4000 [IU] via INTRAVENOUS
  Filled 2023-10-31: qty 4000

## 2023-10-31 MED ORDER — IOHEXOL 350 MG/ML SOLN
100.0000 mL | Freq: Once | INTRAVENOUS | Status: AC | PRN
  Administered 2023-10-31: 100 mL via INTRAVENOUS

## 2023-10-31 MED ORDER — HEPARIN (PORCINE) 25000 UT/250ML-% IV SOLN
1150.0000 [IU]/h | INTRAVENOUS | Status: DC
  Administered 2023-10-31: 1100 [IU]/h via INTRAVENOUS
  Filled 2023-10-31: qty 250

## 2023-10-31 NOTE — ED Provider Notes (Signed)
 [facility] Provider Note    Event Date/Time   First MD Initiated Contact with Patient 10/31/23 1937     (approximate)   History   Chest Pain   HPI Cassidy Thomas is a 48 y.o. female with a history of schizoaffective disorder intellectual disability presenting today for chest pain.  Patient states she started having chest pain earlier this morning when she was picking up close out of the laundry.  States that has been present throughout most of the day and is midline.  Denies shortness of breath, nausea, vomiting, sweating.  No recent illnesses.  No leg pain or leg swelling.  No history of blood clots.  No prior cardiac history and no family history of cardiac issues at a young age.     Physical Exam   Triage Vital Signs: ED Triage Vitals  Encounter Vitals Group     BP 10/31/23 1750 101/79     Systolic BP Percentile --      Diastolic BP Percentile --      Pulse Rate 10/31/23 1750 (!) 103     Resp 10/31/23 1750 17     Temp 10/31/23 1750 97.8 F (36.6 C)     Temp src --      SpO2 10/31/23 1750 97 %     Weight 10/31/23 1745 230 lb (104.3 kg)     Height 10/31/23 1745 5' 7 (1.702 m)     Head Circumference --      Peak Flow --      Pain Score 10/31/23 1745 7     Pain Loc --      Pain Education --      Exclude from Growth Chart --     Most recent vital signs: Vitals:   10/31/23 2143 10/31/23 2245  BP:    Pulse:  96  Resp:  15  Temp: 97.8 F (36.6 C)   SpO2:  98%   Physical Exam: I have reviewed the vital signs and nursing notes. General: Awake, alert, no acute distress.  Nontoxic appearing. Head:  Atraumatic, normocephalic.   ENT:  EOM intact, PERRL. Oral mucosa is pink and moist with no lesions. Neck: Neck is supple with full range of motion, No meningeal signs. Cardiovascular:  RRR, No murmurs. Peripheral pulses palpable and equal bilaterally. Respiratory:  Symmetrical chest wall expansion.  No rhonchi, rales, or wheezes.  Good air  movement throughout.  No use of accessory muscles.   Musculoskeletal:  No cyanosis or edema. Moving extremities with full ROM Abdomen:  Soft, nontender, nondistended. Neuro:  GCS 15, moving all four extremities, interacting appropriately. Speech clear. Psych:  Calm, appropriate.   Skin:  Warm, dry, no rash.    ED Results / Procedures / Treatments   Labs (all labs ordered are listed, but only abnormal results are displayed) Labs Reviewed  BASIC METABOLIC PANEL - Abnormal; Notable for the following components:      Result Value   Glucose, Bld 117 (*)    All other components within normal limits  D-DIMER, QUANTITATIVE - Abnormal; Notable for the following components:   D-Dimer, Quant 2.79 (*)    All other components within normal limits  TROPONIN I (HIGH SENSITIVITY) - Abnormal; Notable for the following components:   Troponin I (High Sensitivity) 195 (*)    All other components within normal limits  TROPONIN I (HIGH SENSITIVITY) - Abnormal; Notable for the following components:   Troponin I (High Sensitivity) 186 (*)  All other components within normal limits  CBC  PROTIME-INR  APTT  HEPARIN  LEVEL (UNFRACTIONATED)  CBC  BRAIN NATRIURETIC PEPTIDE  HEPATIC FUNCTION PANEL  RPR  HIV ANTIBODY (ROUTINE TESTING W REFLEX)  URINE DRUG SCREEN, QUALITATIVE ([facility] ONLY)  POC URINE PREG, ED     EKG My EKG interpretation: Rate of 109, sinus tachycardia, possible left axis deviation, no acute ST elevations or depressions   RADIOLOGY Independent interpreted chest x-ray with no acute pathology   PROCEDURES:  Critical Care performed: Yes, see critical care procedure note(s)  .Critical Care  Performed by: Malvina Alm DASEN, MD Authorized by: Malvina Alm DASEN, MD   Critical care provider statement:    Critical care time (minutes):  30   Critical care was necessary to treat or prevent imminent or life-threatening deterioration of the following conditions:  Circulatory failure  (Submassive PE)   Critical care was time spent personally by me on the following activities:  Development of treatment plan with patient or surrogate, discussions with consultants, evaluation of patient's response to treatment, examination of patient, ordering and review of laboratory studies, ordering and review of radiographic studies, ordering and performing treatments and interventions, pulse oximetry, re-evaluation of patient's condition and review of old charts   I assumed direction of critical care for this patient from another provider in my specialty: no     Care discussed with: admitting provider      MEDICATIONS ORDERED IN ED: Medications  heparin  ADULT infusion 100 units/mL (25000 units/250mL) (1,100 Units/hr Intravenous New Bag/Given 10/31/23 2204)  heparin  bolus via infusion 4,000 Units (4,000 Units Intravenous Bolus from Bag 10/31/23 2204)  iohexol  (OMNIPAQUE ) 350 MG/ML injection 100 mL (100 mLs Intravenous Contrast Given 10/31/23 2218)     IMPRESSION / MDM / ASSESSMENT AND PLAN / ED COURSE  I reviewed the triage vital signs and the nursing notes.                              Differential diagnosis includes, but is not limited to, ACS, PE, pneumonia, pneumothorax, reflux  Patient's presentation is most consistent with acute presentation with potential threat to life or bodily function.  Patient is a 48 year old female presenting today for chest pain since this morning with no associated symptoms.  Mild tachycardia on arrival.  Exam otherwise unremarkable.  EKG without obvious ischemia.  CBC and BMP unremarkable.  First troponin was elevated at 195 with no prior history of the same.  D-dimer was ordered which was elevated.  CTA chest ordered shows evidence of bilateral PEs with right heart strain.  Consulted Dr. Marea with vascular surgery.  Agrees with heparin  drip.  N.p.o. at midnight for possible thrombectomy tomorrow.  No other immediate intervention.  Patient admitted to  hospitalist for further care.  The patient is on the cardiac monitor to evaluate for evidence of arrhythmia and/or significant heart rate changes. Clinical Course as of 10/31/23 2327  Mon Oct 31, 2023  2231 CT Angio Chest PE W and/or Wo Contrast Bilateral PE and right heart strain. [DW]  2237 CT Angio Chest PE W and/or Wo Contrast Positive for acute PE with CT evidence of right heart strain [DW]  2240 Dr. Marea - NPO midnight, heparin , possible thrombectomy tomorrow [DW]  2327 Admit by Dr. Tobie [DW]    Clinical Course User Index [DW] Malvina Alm DASEN, MD     FINAL CLINICAL IMPRESSION(S) / ED DIAGNOSES   Final diagnoses:  Acute  pulmonary embolism with acute cor pulmonale, unspecified pulmonary embolism type (HCC)     Rx / DC Orders   ED Discharge Orders     None        Note:  This document was prepared using Dragon voice recognition software and may include unintentional dictation errors.   Malvina Alm DASEN, MD 10/31/23 [phone]

## 2023-10-31 NOTE — ED Notes (Signed)
 Reviewed pt's CC and results; acuity level changed and charge nurse aware exam room needed for further evaluation

## 2023-10-31 NOTE — Progress Notes (Signed)
 PHARMACY - ANTICOAGULATION CONSULT NOTE  Pharmacy Consult for Heparin  Infusion Indication: chest pain/ACS  Allergies  Allergen Reactions   Albuterol Sulfate Other (See Comments)    Pt states it makes her have a bad feeling.    Other Other (See Comments)    Patient is allergic to eye drops causes eyes to swell    Patient Measurements: Height: 5' 7 (170.2 cm) Weight: 104.3 kg (230 lb) IBW/kg (Calculated) : 61.6 Heparin  Dosing Weight: 85.2 kg  Vital Signs: Temp: 97.8 F (36.6 C) (01/06 1750) BP: 101/79 (01/06 1750) Pulse Rate: 103 (01/06 1750)  Labs: Recent Labs    10/31/23 1747  HGB 13.7  HCT 41.2  PLT 182  CREATININE 0.94  TROPONINIHS 195*    Estimated Creatinine Clearance: 93.9 mL/min (by C-G formula based on SCr of 0.94 mg/dL).   Medical History: Past Medical History:  Diagnosis Date   Asthma    Bipolar 1 disorder (HCC)    Schizophrenia (HCC)    Substance abuse (HCC)     Medications:  Not on anticoagulation at home  Assessment: Patient is a 48 year old female who present from group home with chest pain. Troponin 195. Pharmacy has been consulted to initiate patient on a heparin  infusion for ACS/STEMI. Patient was not on anticoagulation at home.   Baseline INR and aPTT ordered.  No signs/symptoms of bleeding noted. Hgb 13.7. PLT 182.  Goal of Therapy:  Heparin  level 0.3-0.7 units/ml Monitor platelets by anticoagulation protocol: Yes   Plan:  Give 4000 unit bolus x 1 Start heparin  infusion at a rate of 1100 units/hr Check heparin  level in 6 hours Monitor CBC daily  Lum VEAR Mania, PharmD Clinical Pharmacist 10/31/2023,8:25 PM

## 2023-10-31 NOTE — ED Triage Notes (Signed)
 Pt arrives via EMS from a group home. Pt sts that she was taking clothes out of the washer today and started to have CP in the center of the chest. Pt sts that pain gets work with movement and palpation.

## 2023-10-31 NOTE — H&P (Signed)
 History and Physical    Patient: Cassidy Thomas FMW:[id number] DOB: Apr 26, 1976 DOA: 10/31/2023 DOS: the patient was seen and examined on 11/01/2023 PCP: Donal Channing SQUIBB, FNP  Patient coming from: Home Chief complaint: Chief Complaint  Patient presents with   Chest Pain   HPI:  Cassidy Thomas is a 48 y.o. female with past medical history  of allergies to albuterol and eyedrops, presenting today with acute onset of chest pain.  Patient is coming from a group home via EMS she was taking close out of the washer today when she started to have midsternal chest pain that is worse with movement associated with palpitation and no shortness of breath.  Per report patient is on oral contraceptives.  No other complaints of fevers chills falls headache speech or gait issues.  ED Course: In emergency room vitals trend shows: Patient meets SIRS criteria. Vitals:   10/31/23 2130 10/31/23 2143 10/31/23 2245 10/31/23 2330  BP: 109/61   100/71  Pulse: 93  96 91  Temp:  97.8 F (36.6 C)    Resp: (!) 25  15 19   Height:      Weight:      SpO2: 96%  98% 98%  TempSrc:  Oral    BMI (Calculated):      EKG today shows sinus tach at 109 with left axis deviation and no ST-T wave changes, elevated troponin at 195 and 186 with elevated BNP of two 9.7 Evaluation so far shows: Basic metabolic panel showing glucose of 117 LFTs added on and pending. CBC is within normal limits. Elevated D-dimer 2.79. CT angio chest done shows acute PE with right heart strain with submassive PE.Filling defects within the pulmonary arteries bilaterally involving all lobes of both lungs compatible with acute pulmonary emboli. Heart is normal size. RV/LV ratio elevated at 1.44 compatible with right heart strain. Aorta normal caliber.   In the ED pt received: Medications  heparin  ADULT infusion 100 units/mL (25000 units/250mL) (1,100 Units/hr Intravenous New Bag/Given 10/31/23 2204)  sodium chloride  flush (NS) 0.9 % injection 3 mL  (3 mLs Intravenous Given 11/01/23 0151)  acetaminophen  (TYLENOL ) tablet 650 mg (has no administration in time range)    Or  acetaminophen  (TYLENOL ) suppository 650 mg (has no administration in time range)  morphine  (PF) 2 MG/ML injection 2 mg (has no administration in time range)  0.9 %  sodium chloride  infusion (has no administration in time range)  nicotine  (NICODERM CQ  - dosed in mg/24 hours) patch 14 mg (has no administration in time range)  nitroGLYCERIN  (NITROSTAT ) SL tablet 0.4 mg (has no administration in time range)  acyclovir  (ZOVIRAX ) tablet 400 mg (has no administration in time range)  lithium  carbonate (LITHOBID ) ER tablet 300 mg (has no administration in time range)  risperiDONE  (RISPERDAL ) tablet 1 mg (has no administration in time range)  traZODone  (DESYREL ) tablet 50 mg (has no administration in time range)  heparin  bolus via infusion 4,000 Units (4,000 Units Intravenous Bolus from Bag 10/31/23 2204)  iohexol  (OMNIPAQUE ) 350 MG/ML injection 100 mL (100 mLs Intravenous Contrast Given 10/31/23 2218)     Review of Systems  Cardiovascular:  Positive for chest pain and palpitations.  All other systems reviewed and are negative.  Past Medical History:  Diagnosis Date   Asthma    Bipolar 1 disorder (HCC)    Schizophrenia (HCC)    Substance abuse (HCC)    Past Surgical History:  Procedure Laterality Date   BREAST LUMPECTOMY  2012   right breast  reports that she has been smoking. She has never used smokeless tobacco. She reports that she does not currently use drugs. She reports that she does not drink alcohol .  Allergies  Allergen Reactions   Albuterol Sulfate Other (See Comments)    Pt states it makes her have a bad feeling.    Other Other (See Comments)    Patient is allergic to eye drops causes eyes to swell    Family History  Problem Relation Age of Onset   Cancer Maternal Grandmother     Prior to Admission medications   Medication Sig Start Date End Date  Taking? Authorizing Provider  acyclovir  (ZOVIRAX ) 800 MG tablet Take 0.5 tablets (400 mg total) by mouth 3 (three) times daily. 12/03/21   Garner, Meteea, FNP  amantadine  (SYMMETREL ) 100 MG capsule Take 1 capsule (100 mg total) by mouth 2 (two) times daily. 04/21/15   Jacquetta Sharlot GRADE, NP  lithium  carbonate (LITHOBID ) 300 MG CR tablet Take 1 tablet (300 mg total) by mouth every 12 (twelve) hours. 04/21/15   Jacquetta Sharlot GRADE, NP  risperiDONE  (RISPERDAL ) 1 MG tablet Take 1 tablet (1 mg total) by mouth 2 (two) times daily. 04/21/15   Jacquetta Sharlot GRADE, NP  traZODone  (DESYREL ) 50 MG tablet Take 1 tablet (50 mg total) by mouth at bedtime as needed and may repeat dose one time if needed for sleep. 04/21/15   Jacquetta Sharlot GRADE, NP     Vitals:   10/31/23 2130 10/31/23 2143 10/31/23 2245 10/31/23 2330  BP: 109/61   100/71  Pulse: 93  96 91  Resp: (!) 25  15 19   Temp:  97.8 F (36.6 C)    TempSrc:  Oral    SpO2: 96%  98% 98%  Weight:      Height:       Physical Exam Vitals and nursing note reviewed.  Constitutional:      General: She is not in acute distress. HENT:     Head: Normocephalic and atraumatic.     Right Ear: Hearing normal.     Left Ear: Hearing normal.     Nose: Nose normal. No nasal deformity.     Mouth/Throat:     Lips: Pink.     Tongue: No lesions.     Pharynx: Oropharynx is clear.  Eyes:     General: Lids are normal.     Extraocular Movements: Extraocular movements intact.  Cardiovascular:     Rate and Rhythm: Regular rhythm. Tachycardia present.     Heart sounds: Normal heart sounds.  Pulmonary:     Effort: Pulmonary effort is normal.     Breath sounds: Normal breath sounds.  Abdominal:     General: Bowel sounds are normal. There is no distension.     Palpations: Abdomen is soft. There is no mass.     Tenderness: There is no abdominal tenderness.  Musculoskeletal:     Right lower leg: No edema.     Left lower leg: No edema.  Skin:    General: Skin is warm.   Neurological:     General: No focal deficit present.     Mental Status: She is alert and oriented to person, place, and time.     Cranial Nerves: Cranial nerves 2-12 are intact.  Psychiatric:        Attention and Perception: Attention normal.        Mood and Affect: Mood normal.        Speech: Speech normal.  Behavior: Behavior normal. Behavior is cooperative.     Labs on Admission: I have personally reviewed following labs and imaging studies Results for orders placed or performed during the hospital encounter of 10/31/23 (from the past 24 hours)  Basic metabolic panel     Status: Abnormal   Collection Time: 10/31/23  5:47 PM  Result Value Ref Range   Sodium 137 135 - 145 mmol/L   Potassium 3.9 3.5 - 5.1 mmol/L   Chloride 106 98 - 111 mmol/L   CO2 22 22 - 32 mmol/L   Glucose, Bld 117 (H) 70 - 99 mg/dL   BUN 10 6 - 20 mg/dL   Creatinine, Ser 9.05 0.44 - 1.00 mg/dL   Calcium 9.1 8.9 - 89.6 mg/dL   GFR, Estimated >39 >39 mL/min   Anion gap 9 5 - 15  CBC     Status: None   Collection Time: 10/31/23  5:47 PM  Result Value Ref Range   WBC 9.2 4.0 - 10.5 K/uL   RBC 4.62 3.87 - 5.11 MIL/uL   Hemoglobin 13.7 12.0 - 15.0 g/dL   HCT 58.7 63.9 - 53.9 %   MCV 89.2 80.0 - 100.0 fL   MCH 29.7 26.0 - 34.0 pg   MCHC 33.3 30.0 - 36.0 g/dL   RDW 87.6 88.4 - 84.4 %   Platelets 182 150 - 400 K/uL   nRBC 0.0 0.0 - 0.2 %  Troponin I (High Sensitivity)     Status: Abnormal   Collection Time: 10/31/23  5:47 PM  Result Value Ref Range   Troponin I (High Sensitivity) 195 (HH) <18 ng/L  Brain natriuretic peptide     Status: Abnormal   Collection Time: 10/31/23  5:47 PM  Result Value Ref Range   B Natriuretic Peptide 209.7 (H) 0.0 - 100.0 pg/mL  Troponin I (High Sensitivity)     Status: Abnormal   Collection Time: 10/31/23  8:20 PM  Result Value Ref Range   Troponin I (High Sensitivity) 186 (HH) <18 ng/L  Hepatic function panel     Status: Abnormal   Collection Time: 10/31/23  8:20 PM   Result Value Ref Range   Total Protein 7.1 6.5 - 8.1 g/dL   Albumin 3.1 (L) 3.5 - 5.0 g/dL   AST 18 15 - 41 U/L   ALT 13 0 - 44 U/L   Alkaline Phosphatase 97 38 - 126 U/L   Total Bilirubin 0.3 0.0 - 1.2 mg/dL   Bilirubin, Direct <9.8 0.0 - 0.2 mg/dL   Indirect Bilirubin NOT CALCULATED 0.3 - 0.9 mg/dL  D-dimer, quantitative     Status: Abnormal   Collection Time: 10/31/23  8:30 PM  Result Value Ref Range   D-Dimer, Quant 2.79 (H) 0.00 - 0.50 ug/mL-FEU  Protime-INR     Status: None   Collection Time: 10/31/23  8:30 PM  Result Value Ref Range   Prothrombin Time 13.9 11.4 - 15.2 seconds   INR 1.1 0.8 - 1.2  APTT     Status: None   Collection Time: 10/31/23  8:30 PM  Result Value Ref Range   aPTT 27 24 - 36 seconds   No results found for this or any previous visit (from the past 720 hours). CBC:    Latest Ref Rng & Units 10/31/2023    5:47 PM 01/24/2023    8:38 PM 05/06/2015    2:55 PM  CBC  WBC 4.0 - 10.5 K/uL 9.2  6.4  4.8   Hemoglobin 12.0 -  15.0 g/dL 86.2  87.6  87.4   Hematocrit 36.0 - 46.0 % 41.2  37.6  37.5   Platelets 150 - 400 K/uL 182  242  215    Basic Metabolic Panel: Recent Labs  Lab 10/31/23 1747  NA 137  K 3.9  CL 106  CO2 22  GLUCOSE 117*  BUN 10  CREATININE 0.94  CALCIUM 9.1   Creatinine: Lab Results  Component Value Date   CREATININE 0.94 10/31/2023   CREATININE 0.89 01/24/2023   CREATININE 0.72 05/06/2015    Liver Function Tests:    Latest Ref Rng & Units 10/31/2023    8:20 PM 05/06/2015    2:55 PM 04/17/2015    6:54 PM  Hepatic Function  Total Protein 6.5 - 8.1 g/dL 7.1  6.7  8.5   Albumin 3.5 - 5.0 g/dL 3.1  3.9  4.7   AST 15 - 41 U/L 18  17  29    ALT 0 - 44 U/L 13  8  11    Alk Phosphatase 38 - 126 U/L 97  145  173   Total Bilirubin 0.0 - 1.2 mg/dL 0.3  0.4  1.0   Bilirubin, Direct 0.0 - 0.2 mg/dL <9.8      Coagulation Profile: Recent Labs  Lab 10/31/23 2030  INR 1.1   Cardiac Enzymes: No results for input(s): CKTOTAL, CKMB,  CKMBINDEX, TROPONINI in the last 168 hours. BNP (last 3 results) No results for input(s): PROBNP in the last 8760 hours. HbA1C: No results for input(s): HGBA1C in the last 72 hours. Lipid Profile: No results for input(s): CHOL, HDL, LDLCALC, TRIG, CHOLHDL, LDLDIRECT in the last 72 hours. Urinalysis    Component Value Date/Time   COLORURINE ORANGE (A) 10/02/2012 1213   APPEARANCEUR HAZY (A) 10/02/2012 1213   LABSPEC 1.020 12/17/2013 1040   PHURINE 6.5 12/17/2013 1040   GLUCOSEU NEGATIVE 12/17/2013 1040   HGBUR MODERATE (A) 12/17/2013 1040   BILIRUBINUR NEGATIVE 12/17/2013 1040   KETONESUR NEGATIVE 12/17/2013 1040   PROTEINUR NEGATIVE 12/17/2013 1040   UROBILINOGEN 4.0 (H) 12/17/2013 1040   NITRITE NEGATIVE 12/17/2013 1040   LEUKOCYTESUR TRACE (A) 12/17/2013 1040     Unresulted Labs (From admission, onward)     Start     Ordered   11/01/23 0500  CBC  Tomorrow morning,   R        10/31/23 2029   11/01/23 0500  Comprehensive metabolic panel  Tomorrow morning,   R        11/01/23 0032   11/01/23 0300  Heparin  level (unfractionated)  Once-Timed,   URGENT        10/31/23 2029   11/01/23 0031  Hemoglobin A1c  Add-on,   AD        11/01/23 0032   10/31/23 2259  RPR  Add-on,   AD        10/31/23 2258   10/31/23 2259  HIV Antibody (routine testing w rflx)  (HIV Antibody (Routine testing w reflex) panel)  Once,   URGENT        10/31/23 2258   10/31/23 2259  Urine Drug Screen, Qualitative ([facility] only)  Add-on,   AD        10/31/23 2258            Radiological Exams on Admission: CT Angio Chest PE W and/or Wo Contrast Result Date: 10/31/2023 CLINICAL DATA:  Midline chest pain, elevated troponin, D-dimer elevated. Evaluating for PE EXAM: CT ANGIOGRAPHY CHEST WITH CONTRAST TECHNIQUE: Multidetector  CT imaging of the chest was performed using the standard protocol during bolus administration of intravenous contrast. Multiplanar CT image reconstructions and MIPs were  obtained to evaluate the vascular anatomy. RADIATION DOSE REDUCTION: This exam was performed according to the departmental dose-optimization program which includes automated exposure control, adjustment of the mA and/or kV according to patient size and/or use of iterative reconstruction technique. CONTRAST:  OMNIPAQUE  IOHEXOL  350 MG/ML SOLN COMPARISON:  None Available. FINDINGS: Cardiovascular: Filling defects within the pulmonary arteries bilaterally involving all lobes of both lungs compatible with acute pulmonary emboli. Heart is normal size. RV/LV ratio elevated at 1.44 compatible with right heart strain. Aorta normal caliber. Mediastinum/Nodes: No mediastinal, hilar, or axillary adenopathy. Trachea and esophagus are unremarkable. Thyroid unremarkable. Small to moderate-sized hiatal hernia. Lungs/Pleura: Lungs are clear. No focal airspace opacities or suspicious nodules. No effusions. Upper Abdomen: No acute findings Musculoskeletal: Chest wall soft tissues are unremarkable. No acute bony abnormality. Review of the MIP images confirms the above findings. IMPRESSION: Positive for acute PE with CT evidence of right heart strain (RV/LV Ratio = 1.44) consistent with at least submassive (intermediate risk) PE. The presence of right heart strain has been associated with an increased risk of morbidity and mortality. Please refer to the Code PE Focused order set in EPIC. Small to moderate-sized hiatal hernia. Critical Value/emergent results were called by telephone at the time of interpretation on 10/31/2023 at 10:30 pm to provider DAVID [facility] , who verbally acknowledged these results. Electronically Signed   By: Franky Crease M.D.   On: 10/31/2023 22:32   DG Chest 2 View Result Date: 10/31/2023 CLINICAL DATA:  Chest pain EXAM: CHEST - 2 VIEW COMPARISON:  None Available. FINDINGS: No consolidation, pneumothorax or effusion. No edema. Normal cardiopericardial silhouette. Frontal view is slightly rotated to the  left. Overlapping gown snaps. IMPRESSION: No acute cardiopulmonary disease. Electronically Signed   By: Ranell Bring M.D.   On: 10/31/2023 18:21    Data Reviewed: Relevant notes from primary care and specialist visits, past discharge summaries as available in EHR, including Care Everywhere. Prior diagnostic testing as pertinent to current admission diagnoses, Updated medications and problem lists for reconciliation ED course, including vitals, labs, imaging, treatment and response to treatment,Triage notes, nursing and pharmacy notes and ED provider's notes Notable results as noted in HPI.Discussed case with EDMD/ ED APP/ or Specialty MD on call and as needed.  Assessment and Plan: * Chest pain As needed Tylenol , as needed morphine , as needed nitroglycerin .  Pulmonary embolism (HCC) 2/2 oral contraceptive pill. Currently we will continue patient on heparin  GTT. Patient is high risk for decline due to right heart strain will monitor closely in stepdown unit. Vascular surgery aware of consult a.m. message sent.  Asthma Currently stable . SpO2: 98 % .  Albuterol as needed.   Bipolar 1 disorder (HCC) N.p.o. after midnight.  Resume home meds once med rec is available.  Will continue patient on lithium  risperidone .   Prognosis: Guarded  DVT prophylaxis:  Anticoagulation with heparin  gtt. Consults:  Vascular: Dr. Marea Advance Care Planning:    Code Status: Full Code   Family Communication:  None Disposition Plan:  Home Severity of Illness: The appropriate patient status for this patient is INPATIENT. Inpatient status is judged to be reasonable and necessary in order to provide the required intensity of service to ensure the patient's safety. The patient's presenting symptoms, physical exam findings, and initial radiographic and laboratory data in the context of their chronic comorbidities is  felt to place them at high risk for further clinical deterioration. Furthermore, it is not  anticipated that the patient will be medically stable for discharge from the hospital within 2 midnights of admission.   * I certify that at the point of admission it is my clinical judgment that the patient will require inpatient hospital care spanning beyond 2 midnights from the point of admission due to high intensity of service, high risk for further deterioration and high frequency of surveillance required.*  Author: Mario LULLA Blanch, MD 11/01/2023 2:19 AM  For on call review [email or web address].   Orders Placed This Encounter  Procedures   Critical Care   DG Chest 2 View   CT Angio Chest PE W and/or Wo Contrast   Basic metabolic panel   CBC   D-dimer, quantitative   Protime-INR   APTT   Heparin  level (unfractionated)   CBC   Brain natriuretic peptide   RPR   HIV Antibody (routine testing w rflx)   Urine Drug Screen, Qualitative ([facility] only)   Comprehensive metabolic panel   Hemoglobin A1c   Hepatic function panel   Diet NPO time specified   Diet NPO time specified   Document Height and Actual Weight   Maintain IV access   Vital signs   Notify physician (specify)   Mobility Protocol: No Restrictions   Refer to Sidebar Report Refer to ICU, Med-Surg, Progressive, and Step-Down Mobility Protocol Sidebars   Daily weights   Intake and Output   Do not place and if present remove PureWick   Initiate Oral Care Protocol   Initiate Carrier Fluid Protocol   RN may order General Admission PRN Orders utilizing General Admission PRN medications (through manage orders) for the following patient needs: allergy symptoms (Claritin), cold sores (Carmex), cough (Robitussin DM), eye irritation (Liquifilm Tears), hemorrhoids (Tucks), indigestion (Maalox), minor skin irritation (Hydrocortisone Cream), muscle pain (Ben Gay), nose irritation (saline nasal spray) and sore throat (Chloraseptic spray).   Cardiac Monitoring - Continuous Indefinite   Full code   heparin  per pharmacy consult   Consult to  hospitalist   Consult to vascular surgery Consult Timeframe: ROUTINE - requires response within 24 hours; Reason for Consult? ACUTE BL PE WITH RIGHT HEART STRAIN.   Pulse oximetry check with vital signs   Oxygen therapy Mode or (Route): Nasal cannula; Liters Per Minute: 2; Keep O2 saturation between: greater than 92 %   POC urine preg, ED   ED EKG   EKG 12-Lead   Admit to Inpatient (patient's expected length of stay will be greater than 2 midnights or inpatient only procedure)   Aspiration precautions  '

## 2023-11-01 ENCOUNTER — Other Ambulatory Visit (HOSPITAL_COMMUNITY): Payer: Self-pay

## 2023-11-01 ENCOUNTER — Inpatient Hospital Stay: Payer: 59

## 2023-11-01 ENCOUNTER — Telehealth (HOSPITAL_COMMUNITY): Payer: Self-pay | Admitting: Pharmacy Technician

## 2023-11-01 ENCOUNTER — Inpatient Hospital Stay
Admit: 2023-11-01 | Discharge: 2023-11-01 | Disposition: A | Discharge status: Home / Self Care | Payer: 59 | Attending: Vascular Surgery | Admitting: Vascular Surgery

## 2023-11-01 ENCOUNTER — Encounter
Admission: EM | Disposition: A | Discharge status: Home / Self Care | Payer: Self-pay | Source: Home / Self Care | Attending: Internal Medicine

## 2023-11-01 DIAGNOSIS — I50810 Right heart failure, unspecified: Secondary | ICD-10-CM

## 2023-11-01 DIAGNOSIS — I2699 Other pulmonary embolism without acute cor pulmonale: Secondary | ICD-10-CM

## 2023-11-01 DIAGNOSIS — R079 Chest pain, unspecified: Secondary | ICD-10-CM | POA: Diagnosis present

## 2023-11-01 DIAGNOSIS — Z888 Allergy status to other drugs, medicaments and biological substances status: Secondary | ICD-10-CM | POA: Diagnosis not present

## 2023-11-01 DIAGNOSIS — J4520 Mild intermittent asthma, uncomplicated: Secondary | ICD-10-CM | POA: Diagnosis not present

## 2023-11-01 DIAGNOSIS — R0902 Hypoxemia: Secondary | ICD-10-CM | POA: Diagnosis present

## 2023-11-01 DIAGNOSIS — Z6836 Body mass index (BMI) 36.0-36.9, adult: Secondary | ICD-10-CM | POA: Diagnosis not present

## 2023-11-01 DIAGNOSIS — F79 Unspecified intellectual disabilities: Secondary | ICD-10-CM | POA: Diagnosis present

## 2023-11-01 DIAGNOSIS — J45909 Unspecified asthma, uncomplicated: Secondary | ICD-10-CM | POA: Diagnosis present

## 2023-11-01 DIAGNOSIS — F17210 Nicotine dependence, cigarettes, uncomplicated: Secondary | ICD-10-CM | POA: Diagnosis present

## 2023-11-01 DIAGNOSIS — F259 Schizoaffective disorder, unspecified: Secondary | ICD-10-CM | POA: Diagnosis present

## 2023-11-01 DIAGNOSIS — E872 Acidosis, unspecified: Secondary | ICD-10-CM | POA: Diagnosis present

## 2023-11-01 DIAGNOSIS — I2609 Other pulmonary embolism with acute cor pulmonale: Secondary | ICD-10-CM | POA: Diagnosis present

## 2023-11-01 DIAGNOSIS — Z7901 Long term (current) use of anticoagulants: Secondary | ICD-10-CM | POA: Diagnosis not present

## 2023-11-01 DIAGNOSIS — F319 Bipolar disorder, unspecified: Secondary | ICD-10-CM | POA: Diagnosis present

## 2023-11-01 DIAGNOSIS — I2489 Other forms of acute ischemic heart disease: Secondary | ICD-10-CM | POA: Diagnosis present

## 2023-11-01 DIAGNOSIS — E669 Obesity, unspecified: Secondary | ICD-10-CM | POA: Diagnosis present

## 2023-11-01 DIAGNOSIS — Z79899 Other long term (current) drug therapy: Secondary | ICD-10-CM | POA: Diagnosis not present

## 2023-11-01 HISTORY — PX: PULMONARY THROMBECTOMY: CATH118295

## 2023-11-01 LAB — CBC
HCT: 38.6 % (ref 36.0–46.0)
Hemoglobin: 13 g/dL (ref 12.0–15.0)
MCH: 29.7 pg (ref 26.0–34.0)
MCHC: 33.7 g/dL (ref 30.0–36.0)
MCV: 88.3 fL (ref 80.0–100.0)
Platelets: 165 10*3/uL (ref 150–400)
RBC: 4.37 MIL/uL (ref 3.87–5.11)
RDW: 12.1 % (ref 11.5–15.5)
WBC: 8.2 10*3/uL (ref 4.0–10.5)
nRBC: 0 % (ref 0.0–0.2)

## 2023-11-01 LAB — URINE DRUG SCREEN, QUALITATIVE (ARMC ONLY)
Amphetamines, Ur Screen: NOT DETECTED
Barbiturates, Ur Screen: NOT DETECTED
Benzodiazepine, Ur Scrn: NOT DETECTED
Cannabinoid 50 Ng, Ur ~~LOC~~: NOT DETECTED
Cocaine Metabolite,Ur ~~LOC~~: NOT DETECTED
MDMA (Ecstasy)Ur Screen: NOT DETECTED
Methadone Scn, Ur: NOT DETECTED
Opiate, Ur Screen: NOT DETECTED
Phencyclidine (PCP) Ur S: NOT DETECTED
Tricyclic, Ur Screen: NOT DETECTED

## 2023-11-01 LAB — COMPREHENSIVE METABOLIC PANEL
ALT: 12 U/L (ref 0–44)
AST: 17 U/L (ref 15–41)
Albumin: 3 g/dL — ABNORMAL LOW (ref 3.5–5.0)
Alkaline Phosphatase: 88 U/L (ref 38–126)
Anion gap: 10 (ref 5–15)
BUN: 9 mg/dL (ref 6–20)
CO2: 20 mmol/L — ABNORMAL LOW (ref 22–32)
Calcium: 8.7 mg/dL — ABNORMAL LOW (ref 8.9–10.3)
Chloride: 105 mmol/L (ref 98–111)
Creatinine, Ser: 0.86 mg/dL (ref 0.44–1.00)
GFR, Estimated: >60 mL/min (ref >60)
Glucose, Bld: 93 mg/dL (ref 70–99)
Potassium: 3.9 mmol/L (ref 3.5–5.1)
Sodium: 135 mmol/L (ref 135–145)
Total Bilirubin: 0.5 mg/dL (ref 0.0–1.2)
Total Protein: 6.8 g/dL (ref 6.5–8.1)

## 2023-11-01 LAB — HEPATIC FUNCTION PANEL
ALT: 13 U/L (ref 0–44)
AST: 18 U/L (ref 15–41)
Albumin: 3.1 g/dL — ABNORMAL LOW (ref 3.5–5.0)
Alkaline Phosphatase: 97 U/L (ref 38–126)
Bilirubin, Direct: <0.1 mg/dL (ref 0.0–0.2)
Total Bilirubin: 0.3 mg/dL (ref 0.0–1.2)
Total Protein: 7.1 g/dL (ref 6.5–8.1)

## 2023-11-01 LAB — HEPARIN LEVEL (UNFRACTIONATED)
Heparin Unfractionated: 0.28 [IU]/mL — ABNORMAL LOW (ref 0.30–0.70)
Heparin Unfractionated: 0.31 [IU]/mL (ref 0.30–0.70)
Heparin Unfractionated: 0.65 [IU]/mL (ref 0.30–0.70)

## 2023-11-01 LAB — HIV ANTIBODY (ROUTINE TESTING W REFLEX): HIV Screen 4th Generation wRfx: NONREACTIVE

## 2023-11-01 LAB — MRSA NEXT GEN BY PCR, NASAL: MRSA by PCR Next Gen: NOT DETECTED

## 2023-11-01 SURGERY — PULMONARY THROMBECTOMY
Anesthesia: Moderate Sedation | Laterality: Bilateral

## 2023-11-01 MED ORDER — HEPARIN (PORCINE) IN NACL 1000-0.9 UT/500ML-% IV SOLN
INTRAVENOUS | Status: DC | PRN
  Administered 2023-11-01: 1000 mL

## 2023-11-01 MED ORDER — LIDOCAINE-EPINEPHRINE (PF) 1 %-1:200000 IJ SOLN
INTRAMUSCULAR | Status: DC | PRN
  Administered 2023-11-01: 10 mL

## 2023-11-01 MED ORDER — FAMOTIDINE 20 MG PO TABS: 40.0000 mg | ORAL_TABLET | Freq: Once | ORAL | Status: DC | PRN

## 2023-11-01 MED ORDER — CEFAZOLIN SODIUM-DEXTROSE 2-4 GM/100ML-% IV SOLN
INTRAVENOUS | Status: AC
  Filled 2023-11-01: qty 100

## 2023-11-01 MED ORDER — MORPHINE SULFATE (PF) 2 MG/ML IV SOLN: 2.0000 mg | INTRAVENOUS | Status: DC | PRN

## 2023-11-01 MED ORDER — CHLORHEXIDINE GLUCONATE CLOTH 2 % EX PADS
6.0000 | MEDICATED_PAD | Freq: Every day | CUTANEOUS | Status: DC
  Administered 2023-11-01 – 2023-11-02 (×2): 6 via TOPICAL

## 2023-11-01 MED ORDER — ACYCLOVIR 400 MG PO TABS: 400.0000 mg | ORAL_TABLET | Freq: Three times a day (TID) | ORAL | Status: DC

## 2023-11-01 MED ORDER — DIPHENHYDRAMINE HCL 50 MG/ML IJ SOLN: 50.0000 mg | Freq: Once | INTRAMUSCULAR | Status: DC | PRN

## 2023-11-01 MED ORDER — FENTANYL CITRATE PF 50 MCG/ML IJ SOSY: 12.5000 ug | PREFILLED_SYRINGE | Freq: Once | INTRAMUSCULAR | Status: DC | PRN

## 2023-11-01 MED ORDER — FENTANYL CITRATE (PF) 100 MCG/2ML IJ SOLN
INTRAMUSCULAR | Status: DC | PRN
  Administered 2023-11-01: 25 ug via INTRAVENOUS
  Administered 2023-11-01: 50 ug via INTRAVENOUS

## 2023-11-01 MED ORDER — MELATONIN 5 MG PO TABS
2.5000 mg | ORAL_TABLET | Freq: Every day | ORAL | Status: DC
  Administered 2023-11-01: 2.5 mg via ORAL
  Filled 2023-11-01: qty 1

## 2023-11-01 MED ORDER — HEPARIN (PORCINE) 25000 UT/250ML-% IV SOLN
INTRAVENOUS | Status: AC
  Filled 2023-11-01: qty 250

## 2023-11-01 MED ORDER — HEPARIN SODIUM (PORCINE) 1000 UNIT/ML IJ SOLN
INTRAMUSCULAR | Status: AC
  Filled 2023-11-01: qty 10

## 2023-11-01 MED ORDER — SIMVASTATIN 20 MG PO TABS
20.0000 mg | ORAL_TABLET | Freq: Every day | ORAL | Status: DC
  Administered 2023-11-01: 20 mg via ORAL
  Filled 2023-11-01: qty 1

## 2023-11-01 MED ORDER — FLUTICASONE FUROATE-VILANTEROL 200-25 MCG/ACT IN AEPB
1.0000 | INHALATION_SPRAY | Freq: Every day | RESPIRATORY_TRACT | Status: DC
  Administered 2023-11-02: 1 via RESPIRATORY_TRACT
  Filled 2023-11-01: qty 28

## 2023-11-01 MED ORDER — SODIUM CHLORIDE 0.9 % IV SOLN: INTRAVENOUS | Status: DC

## 2023-11-01 MED ORDER — FENTANYL CITRATE (PF) 100 MCG/2ML IJ SOLN
INTRAMUSCULAR | Status: AC
  Filled 2023-11-01: qty 2

## 2023-11-01 MED ORDER — CEFAZOLIN SODIUM-DEXTROSE 2-4 GM/100ML-% IV SOLN
2.0000 g | INTRAVENOUS | Status: AC
  Administered 2023-11-01: 2 g via INTRAVENOUS

## 2023-11-01 MED ORDER — HEPARIN BOLUS VIA INFUSION
1300.0000 [IU] | Freq: Once | INTRAVENOUS | Status: AC
  Administered 2023-11-01: 1300 [IU] via INTRAVENOUS
  Filled 2023-11-01: qty 1300

## 2023-11-01 MED ORDER — ACETAMINOPHEN 650 MG RE SUPP: 650.0000 mg | Freq: Four times a day (QID) | RECTAL | Status: DC | PRN

## 2023-11-01 MED ORDER — SODIUM CHLORIDE 0.9% FLUSH
3.0000 mL | Freq: Two times a day (BID) | INTRAVENOUS | Status: DC
  Administered 2023-11-01 – 2023-11-02 (×3): 3 mL via INTRAVENOUS

## 2023-11-01 MED ORDER — NITROGLYCERIN 0.4 MG SL SUBL: 0.4000 mg | SUBLINGUAL_TABLET | SUBLINGUAL | Status: DC | PRN

## 2023-11-01 MED ORDER — NICOTINE 14 MG/24HR TD PT24
14.0000 mg | MEDICATED_PATCH | Freq: Every day | TRANSDERMAL | Status: DC
  Administered 2023-11-02: 14 mg via TRANSDERMAL
  Filled 2023-11-01: qty 1

## 2023-11-01 MED ORDER — SODIUM CHLORIDE 0.9 % IV SOLN
Freq: Once | INTRAVENOUS | Status: AC
Start: 2023-11-01 — End: 2023-11-01

## 2023-11-01 MED ORDER — RISPERIDONE 1 MG PO TABS: 1.0000 mg | ORAL_TABLET | Freq: Two times a day (BID) | ORAL | Status: DC

## 2023-11-01 MED ORDER — METHYLPREDNISOLONE SODIUM SUCC 125 MG IJ SOLR: 125.0000 mg | Freq: Once | INTRAMUSCULAR | Status: DC | PRN

## 2023-11-01 MED ORDER — HYDROXYZINE HCL 25 MG PO TABS: 25.0000 mg | ORAL_TABLET | Freq: Four times a day (QID) | ORAL | Status: DC | PRN

## 2023-11-01 MED ORDER — MIDAZOLAM HCL 2 MG/2ML IJ SOLN
INTRAMUSCULAR | Status: DC | PRN
  Administered 2023-11-01: 2 mg via INTRAVENOUS
  Administered 2023-11-01: 1 mg via INTRAVENOUS

## 2023-11-01 MED ORDER — OLANZAPINE 10 MG PO TABS
10.0000 mg | ORAL_TABLET | Freq: Every day | ORAL | Status: DC
  Administered 2023-11-01: 10 mg via ORAL
  Filled 2023-11-01: qty 1

## 2023-11-01 MED ORDER — ACETAMINOPHEN 325 MG PO TABS: 650.0000 mg | ORAL_TABLET | Freq: Four times a day (QID) | ORAL | Status: DC | PRN

## 2023-11-01 MED ORDER — MIDAZOLAM HCL 5 MG/5ML IJ SOLN
INTRAMUSCULAR | Status: AC
Start: 2023-11-01 — End: ?
  Filled 2023-11-01: qty 5

## 2023-11-01 MED ORDER — LITHIUM CARBONATE ER 300 MG PO TBCR
300.0000 mg | EXTENDED_RELEASE_TABLET | Freq: Two times a day (BID) | ORAL | Status: DC
Start: 2023-11-01 — End: 2023-11-01

## 2023-11-01 MED ORDER — MIDAZOLAM HCL 2 MG/ML PO SYRP: 8.0000 mg | ORAL_SOLUTION | Freq: Once | ORAL | Status: DC | PRN

## 2023-11-01 MED ORDER — DIPHENHYDRAMINE HCL 50 MG/ML IJ SOLN
INTRAMUSCULAR | Status: DC | PRN
  Administered 2023-11-01: 25 mg via INTRAVENOUS

## 2023-11-01 MED ORDER — FAMOTIDINE 20 MG PO TABS
40.0000 mg | ORAL_TABLET | Freq: Every day | ORAL | Status: DC
  Administered 2023-11-01: 40 mg via ORAL
  Filled 2023-11-01: qty 2

## 2023-11-01 MED ORDER — HEPARIN SODIUM (PORCINE) 1000 UNIT/ML IJ SOLN
INTRAMUSCULAR | Status: DC | PRN
  Administered 2023-11-01: 3000 [IU] via INTRAVENOUS

## 2023-11-01 MED ORDER — TRAZODONE HCL 50 MG PO TABS: 50.0000 mg | ORAL_TABLET | Freq: Every evening | ORAL | Status: DC | PRN

## 2023-11-01 MED ORDER — IODIXANOL 320 MG/ML IV SOLN
INTRAVENOUS | Status: DC | PRN
  Administered 2023-11-01: 65 mL

## 2023-11-01 MED ORDER — DIPHENHYDRAMINE HCL 50 MG/ML IJ SOLN
INTRAMUSCULAR | Status: AC
  Filled 2023-11-01: qty 1

## 2023-11-01 MED ORDER — ALTEPLASE 1 MG/ML SYRINGE FOR VASCULAR PROCEDURE
INTRAMUSCULAR | Status: DC | PRN
  Administered 2023-11-01: 8 mg via INTRA_ARTERIAL

## 2023-11-01 MED ORDER — ALTEPLASE 2 MG IJ SOLR
INTRAMUSCULAR | Status: AC
Start: 2023-11-01 — End: ?
  Filled 2023-11-01: qty 8

## 2023-11-01 SURGICAL SUPPLY — 15 items
CANISTER PENUMBRA ENGINE (MISCELLANEOUS) IMPLANT
CATH ANGIO 5F PIGTAIL 100CM (CATHETERS) IMPLANT
CATH INDIGO 12XTORQ 100 (CATHETERS) IMPLANT
CATH INDIGO SEP 12 (CATHETERS) IMPLANT
CATH SELECT BERN TIP 5F 130 (CATHETERS) IMPLANT
CLOSURE PERCLOSE PROSTYLE (VASCULAR PRODUCTS) IMPLANT
COVER PROBE ULTRASOUND 5X96 (MISCELLANEOUS) IMPLANT
GLIDEWIRE ADV .035X180CM (WIRE) IMPLANT
PACK ANGIOGRAPHY (CUSTOM PROCEDURE TRAY) ×1 IMPLANT
SHEATH BRITE TIP 6FRX11 (SHEATH) IMPLANT
SHEATH CHCK-FLO 14FR 13 (SHEATH) IMPLANT
SYR MEDRAD MARK 7 150ML (SYRINGE) IMPLANT
TUBING CONTRAST HIGH PRESS 72 (TUBING) IMPLANT
WIRE GUIDERIGHT .035X150 (WIRE) IMPLANT
WIRE SUPRACORE 300CM (WIRE) IMPLANT

## 2023-11-01 NOTE — Consult Note (Signed)
 PHARMACY - ANTICOAGULATION CONSULT NOTE  Pharmacy Consult for Heparin  Infusion Indication: PE  Allergies  Allergen Reactions   Albuterol Sulfate Other (See Comments)    Pt states it makes her have a bad feeling.    Other Other (See Comments)    Patient is allergic to eye drops causes eyes to swell   Patient Measurements: Height: 5' 7 (170.2 cm) Weight: 104.3 kg (230 lb) IBW/kg (Calculated) : 61.6 Heparin  Dosing Weight: 85.2 kg  Vital Signs: Temp: 97.8 F (36.6 C) (01/07 0653) Temp Source: Oral (01/07 0653) BP: 101/74 (01/07 0500) Pulse Rate: 85 (01/07 0500)  Labs: Recent Labs    10/31/23 1747 10/31/23 2020 10/31/23 2030 11/01/23 0545  HGB 13.7  --   --  13.0  HCT 41.2  --   --  38.6  PLT 182  --   --  165  APTT  --   --  27  --   LABPROT  --   --  13.9  --   INR  --   --  1.1  --   HEPARINUNFRC  --   --   --  0.28*  CREATININE 0.94  --   --  0.86  TROPONINIHS 195* 186*  --   --    Estimated Creatinine Clearance: 102.6 mL/min (by C-G formula based on SCr of 0.86 mg/dL).  Medical History: Past Medical History:  Diagnosis Date   Asthma    Bipolar 1 disorder (HCC)    Schizophrenia (HCC)    Substance abuse (HCC)    Baseline Labs Hgb 13.7. PLT 182  Medications:  Not on anticoagulation at home  Assessment: Patient is a 48 year old female who present from group home with chest pain. Troponin 195. CTA of the chest which reveals pulmonary embolisms with right heart strain. Monitor for vascular recommendations and transition oral anticoagulation. Pharmacy has been consulted to initiate patient on a heparin  infusion.    Goal of Therapy:  Heparin  level 0.3-0.7 units/ml Monitor platelets by anticoagulation protocol: Yes  Heparin  Levels Date/Time HL Clinical Assessment  1/7@0545  HL = 0.28 SUBtherapeutic                   Plan:  Restarted heparin  post cath lab at 1647 at previous rate of heparin  infusion at a rate of 1250 units/hr Recheck heparin  level in  6 hours  Monitor CBC daily  Alfonso MARLA Buys, PharmD Pharmacy Resident  11/01/2023 10:23 AM

## 2023-11-01 NOTE — Progress Notes (Signed)
 PHARMACY - ANTICOAGULATION CONSULT NOTE  Pharmacy Consult for Heparin  Infusion Indication: chest pain/ACS  Allergies  Allergen Reactions   Albuterol Sulfate Other (See Comments)    Pt states it makes her have a bad feeling.    Other Other (See Comments)    Patient is allergic to eye drops causes eyes to swell    Patient Measurements: Height: 5' 7 (170.2 cm) Weight: 104.3 kg (230 lb) IBW/kg (Calculated) : 61.6 Heparin  Dosing Weight: 85.2 kg  Vital Signs: Temp: 97.7 F (36.5 C) (01/07 0255) Temp Source: Oral (01/07 0500) BP: 101/74 (01/07 0500) Pulse Rate: 85 (01/07 0500)  Labs: Recent Labs    10/31/23 1747 10/31/23 2020 10/31/23 2030 11/01/23 0545  HGB 13.7  --   --  13.0  HCT 41.2  --   --  38.6  PLT 182  --   --  165  APTT  --   --  27  --   LABPROT  --   --  13.9  --   INR  --   --  1.1  --   HEPARINUNFRC  --   --   --  0.28*  CREATININE 0.94  --   --  0.86  TROPONINIHS 195* 186*  --   --     Estimated Creatinine Clearance: 102.6 mL/min (by C-G formula based on SCr of 0.86 mg/dL).   Medical History: Past Medical History:  Diagnosis Date   Asthma    Bipolar 1 disorder (HCC)    Schizophrenia (HCC)    Substance abuse (HCC)     Medications:  Not on anticoagulation at home  Assessment: Patient is a 48 year old female who present from group home with chest pain. Troponin 195. Pharmacy has been consulted to initiate patient on a heparin  infusion for ACS/STEMI. Patient was not on anticoagulation at home.   Baseline INR and aPTT ordered.  No signs/symptoms of bleeding noted. Hgb 13.7. PLT 182.  Goal of Therapy:  Heparin  level 0.3-0.7 units/ml Monitor platelets by anticoagulation protocol: Yes  01/07 0545 HL 0.28, subtherapeutic   Plan:  Give 1300 unit bolus x 1 Start heparin  infusion at a rate of 1250 units/hr Recheck heparin  level in 6 hours after rate change Monitor CBC daily  Rankin CANDIE Dills, PharmD, [facility] 11/01/2023 6:26 AM

## 2023-11-01 NOTE — Progress Notes (Signed)
  Progress Note   Patient: Cassidy Thomas FMW:[id number] DOB: 1976-04-19 DOA: 10/31/2023     0 DOS: the patient was seen and examined on 11/01/2023   Brief hospital course: Cassidy Thomas is a 48 y.o. female with past medical history significant for bipolar 1 disorder and asthma presented to ED with acute onset of chest pain, increased with deep breathing and body movements, associated with palpitation but no shortness of breath.  Per report patient is on oral contraceptives.  On presentation she was mildly tachycardic, labs with elevated troponin at 195>>186, D-dimer 2.79. CTA with acute submassive PE with signs of right heart strain.  RV/LV ratio at 1.44.  EKG with sinus tach he, left axis deviation and no significant ST-T changes.  Patient was started on heparin  infusion and vascular surgery was consulted.  1/7: Vital stable, on room air, UDS negative, mild metabolic acidosis with bicarb at 20.  Vascular surgery is planning to take her to the OR for mechanical thrombectomy later today.  Lower extremity venous Doppler and echocardiogram ordered.  Her current risk at this time is just oral contraceptives, hypercoagulable labs ordered.  Assessment and Plan: * Pulmonary embolism (HCC) CTA chest with bilateral submassive PE with concern of right heart strain.  Current risk factor is only oral contraceptives. Vascular surgery was consulted and patient will be going for mechanical thrombectomy later today.  Remained on room air Lower extremity venous Doppler ordered Echocardiogram ordered Hypercoagulable workup ordered. -Continue with heparin  infusion for now  Chest pain Mildly elevated troponin likely due to demand ischemia with submassive PE.  Chest pain currently resolved -Continue to monitor -Supportive care  Asthma No concern of exacerbation at this time - Albuterol as needed.   Bipolar 1 disorder (HCC) -Continue home meds.    Subjective: Patient was seen and examined today.  Chest  pain has been resolved.  Denies any shortness of breath.  Physical Exam: Vitals:   11/01/23 0500 11/01/23 0653 11/01/23 1030 11/01/23 1127  BP: 101/74  125/83   Pulse: 85  98   Resp: 18  (!) 22   Temp:  97.8 F (36.6 C)  98.2 F (36.8 C)  TempSrc: Oral Oral    SpO2: 96%  96%   Weight:      Height:       General.  Obese lady, in no acute distress. Pulmonary.  Lungs clear bilaterally, normal respiratory effort. CV.  Regular rate and rhythm, no JVD, rub or murmur. Abdomen.  Soft, nontender, nondistended, BS positive. CNS.  Alert and oriented .  No focal neurologic deficit. Extremities.  No edema, no cyanosis, pulses intact and symmetrical.   Data Reviewed: Prior data reviewed  Family Communication: Discussed with patient  Disposition: Status is: Inpatient Remains inpatient appropriate because: Severity of illness  Planned Discharge Destination: Home  Time spent: 50 minutes  This record has been created using Conservation officer, historic buildings. Errors have been sought and corrected,but may not always be located. Such creation errors do not reflect on the standard of care.   Author: Amaryllis Dare, MD 11/01/2023 1:42 PM  For on call review [email or web address].

## 2023-11-01 NOTE — Consult Note (Signed)
 Hospital Consult    Reason for Consult:  Acute Pulmonary Emboli Requesting Physician:  Dr Mario Blanch MD  MRN #:  [id number]  History of Present Illness: This is a 48 y.o. female  with past medical history  of allergies to albuterol and eyedrops, presenting today with acute onset of chest pain.  Patient is coming from a group home via EMS she was taking close out of the washer today when she started to have midsternal chest pain that is worse with movement associated with palpitation and no shortness of breath.  Per report patient is on oral contraceptives.   On exam this morning patient is resting comfortably in stretcher in the emergency room.  Patient's oxygen saturation is 95% on room air.  She presents with nonlabored breathing.  She endorses her biggest concern was the chest pain and pressure at the washing machine.  Patient denies any pain in her legs while at rest.  She denies any pain upon ambulation to her legs.  She denies any recent swelling to her lower extremities.  Vascular surgery consulted to evaluate.   Past Medical History:  Diagnosis Date   Asthma    Bipolar 1 disorder (HCC)    Schizophrenia (HCC)    Substance abuse (HCC)     Past Surgical History:  Procedure Laterality Date   BREAST LUMPECTOMY  2012   right breast    Allergies  Allergen Reactions   Albuterol Sulfate Other (See Comments)    Pt states it makes her have a bad feeling.    Other Other (See Comments)    Patient is allergic to eye drops causes eyes to swell    Prior to Admission medications   Medication Sig Start Date End Date Taking? Authorizing Provider  acyclovir  (ZOVIRAX ) 800 MG tablet Take 0.5 tablets (400 mg total) by mouth 3 (three) times daily. 12/03/21   Garner, Meteea, FNP  amantadine  (SYMMETREL ) 100 MG capsule Take 1 capsule (100 mg total) by mouth 2 (two) times daily. 04/21/15   Jacquetta Sharlot GRADE, NP  lithium  carbonate (LITHOBID ) 300 MG CR tablet Take 1 tablet (300 mg total) by mouth every  12 (twelve) hours. 04/21/15   Jacquetta Sharlot GRADE, NP  risperiDONE  (RISPERDAL ) 1 MG tablet Take 1 tablet (1 mg total) by mouth 2 (two) times daily. 04/21/15   Jacquetta Sharlot GRADE, NP  traZODone  (DESYREL ) 50 MG tablet Take 1 tablet (50 mg total) by mouth at bedtime as needed and may repeat dose one time if needed for sleep. 04/21/15   Jacquetta Sharlot GRADE, NP    Social History   Socioeconomic History   Marital status: Single    Spouse name: Not on file   Number of children: Not on file   Years of education: Not on file   Highest education level: Not on file  Occupational History   Not on file  Tobacco Use   Smoking status: Some Days    Current packs/day: 0.25    Types: Cigarettes   Smokeless tobacco: Never  Substance and Sexual Activity   Alcohol  use: No   Drug use: Not Currently    Comment: psdt histoyr. last use of crack cocaine  was last month ago.    Sexual activity: Not Currently    Birth control/protection: OCP, Condom  Other Topics Concern   Not on file  Social History Narrative   ** Merged History Encounter **       Social Drivers of Corporate Investment Banker Strain: Not on file  Food Insecurity: Not on file  Transportation Needs: Not on file  Physical Activity: Not on file  Stress: Not on file  Social Connections: Not on file  Intimate Partner Violence: Not on file     Family History  Problem Relation Age of Onset   Cancer Maternal Grandmother     ROS: Otherwise negative unless mentioned in HPI  Physical Examination  Vitals:   11/01/23 0500 11/01/23 0653  BP: 101/74   Pulse: 85   Resp: 18   Temp:  97.8 F (36.6 C)  SpO2: 96%    Body mass index is 36.02 kg/m.  General:  WDWN in NAD Gait: Not observed HENT: WNL, normocephalic Pulmonary: normal non-labored breathing, without Rales, rhonchi,  wheezing Cardiac: regular, without  Murmurs, rubs or gallops; without carotid bruits Abdomen: Positive bowel sounds throughout, soft, NT/ND, no masses Skin: without  rashes Vascular Exam/Pulses: Palpable pulses throughout, Extremities: without ischemic changes, without Gangrene , without cellulitis; without open wounds;  Musculoskeletal: no muscle wasting or atrophy  Neurologic: A&O X 3;  No focal weakness or paresthesias are detected; speech is fluent/normal Psychiatric:  The pt has Normal affect. Lymph:  Unremarkable  CBC    Component Value Date/Time   WBC 8.2 11/01/2023 0545   RBC 4.37 11/01/2023 0545   HGB 13.0 11/01/2023 0545   HCT 38.6 11/01/2023 0545   PLT 165 11/01/2023 0545   MCV 88.3 11/01/2023 0545   MCH 29.7 11/01/2023 0545   MCHC 33.7 11/01/2023 0545   RDW 12.1 11/01/2023 0545   LYMPHSABS 2.1 05/31/2013 1143   MONOABS 0.3 05/31/2013 1143   EOSABS 0.1 05/31/2013 1143   BASOSABS 0.0 05/31/2013 1143    BMET    Component Value Date/Time   NA 135 11/01/2023 0545   K 3.9 11/01/2023 0545   CL 105 11/01/2023 0545   CO2 20 (L) 11/01/2023 0545   GLUCOSE 93 11/01/2023 0545   BUN 9 11/01/2023 0545   CREATININE 0.86 11/01/2023 0545   CALCIUM 8.7 (L) 11/01/2023 0545   GFRNONAA >60 11/01/2023 0545   GFRAA >60 05/06/2015 1455    COAGS: Lab Results  Component Value Date   INR 1.1 10/31/2023     Non-Invasive Vascular Imaging:   EXAM:10/31/23 CT ANGIOGRAPHY CHEST WITH CONTRAST   TECHNIQUE: Multidetector CT imaging of the chest was performed using the standard protocol during bolus administration of intravenous contrast. Multiplanar CT image reconstructions and MIPs were obtained to evaluate the vascular anatomy.   RADIATION DOSE REDUCTION: This exam was performed according to the departmental dose-optimization program which includes automated exposure control, adjustment of the mA and/or kV according to patient size and/or use of iterative reconstruction technique.   CONTRAST:  OMNIPAQUE  IOHEXOL  350 MG/ML SOLN   COMPARISON:  None Available.   FINDINGS: Cardiovascular: Filling defects within the pulmonary  arteries bilaterally involving all lobes of both lungs compatible with acute pulmonary emboli. Heart is normal size. RV/LV ratio elevated at 1.44 compatible with right heart strain. Aorta normal caliber.   Mediastinum/Nodes: No mediastinal, hilar, or axillary adenopathy. Trachea and esophagus are unremarkable. Thyroid unremarkable. Small to moderate-sized hiatal hernia.   Lungs/Pleura: Lungs are clear. No focal airspace opacities or suspicious nodules. No effusions.   Upper Abdomen: No acute findings   Musculoskeletal: Chest wall soft tissues are unremarkable. No acute bony abnormality.   Review of the MIP images confirms the above findings.   IMPRESSION: Positive for acute PE with CT evidence of right heart strain (RV/LV Ratio = 1.44) consistent  with at least submassive (intermediate risk) PE. The presence of right heart strain has been associated with an increased risk of morbidity and mortality. Please refer to the Code PE Focused order set in EPIC.   Small to moderate-sized hiatal hernia.   Critical Value/emergent results were called by telephone at the time of interpretation on 10/31/2023 at 10:30 pm to provider [facility]ID [facility] , who verbally acknowledged these results.  Ultrasound bilateral lower extremities for DVTs ordered  Statin:  No. Beta Blocker:  No. Aspirin:  No. ACEI:  No. ARB:  No. CCB use:  No Other antiplatelets/anticoagulants:  No.    ASSESSMENT/PLAN: This is a 48 y.o. female with a history of schizophrenic bipolar disorder and substance abuse presents to [facility] emergency department after she started to have midsternal chest pain that was worsening with movement.  She reported no shortness of breath but did feel palpitations.  Upon workup here in the emergency department patient underwent CTA of the chest which reveals pulmonary embolisms with right heart strain at 1.44.  Patient's vitals all remained stable patient was started on a heparin   infusion.  Vascular surgery plans on taking the patient to the vascular lab later today on 11/01/2023 for a pulmonary thrombectomy.  I discussed in detail this morning with the patient the procedure, benefits, risk, and complications.  She verbalizes her understanding and wishes to proceed.  I answered all of her questions this morning.  Patient has been n.p.o. since midnight last night.   -I discussed the plan in detail with Dr. Selinda Gu MD and he agrees with the plan   Gwendlyn JONELLE Shank [facility] 11/01/2023 7:18 AM

## 2023-11-01 NOTE — H&P (View-Only) (Signed)
 Hospital Consult    Reason for Consult:  Acute Pulmonary Emboli Requesting Physician:  Dr Mario Blanch MD  MRN #:  [id number]  History of Present Illness: This is a 48 y.o. female  with past medical history  of allergies to albuterol and eyedrops, presenting today with acute onset of chest pain.  Patient is coming from a group home via EMS she was taking close out of the washer today when she started to have midsternal chest pain that is worse with movement associated with palpitation and no shortness of breath.  Per report patient is on oral contraceptives.   On exam this morning patient is resting comfortably in stretcher in the emergency room.  Patient's oxygen saturation is 95% on room air.  She presents with nonlabored breathing.  She endorses her biggest concern was the chest pain and pressure at the washing machine.  Patient denies any pain in her legs while at rest.  She denies any pain upon ambulation to her legs.  She denies any recent swelling to her lower extremities.  Vascular surgery consulted to evaluate.   Past Medical History:  Diagnosis Date   Asthma    Bipolar 1 disorder (HCC)    Schizophrenia (HCC)    Substance abuse (HCC)     Past Surgical History:  Procedure Laterality Date   BREAST LUMPECTOMY  2012   right breast    Allergies  Allergen Reactions   Albuterol Sulfate Other (See Comments)    Pt states it makes her have a bad feeling.    Other Other (See Comments)    Patient is allergic to eye drops causes eyes to swell    Prior to Admission medications   Medication Sig Start Date End Date Taking? Authorizing Provider  acyclovir  (ZOVIRAX ) 800 MG tablet Take 0.5 tablets (400 mg total) by mouth 3 (three) times daily. 12/03/21   Garner, Meteea, FNP  amantadine  (SYMMETREL ) 100 MG capsule Take 1 capsule (100 mg total) by mouth 2 (two) times daily. 04/21/15   Jacquetta Sharlot GRADE, NP  lithium  carbonate (LITHOBID ) 300 MG CR tablet Take 1 tablet (300 mg total) by mouth every  12 (twelve) hours. 04/21/15   Jacquetta Sharlot GRADE, NP  risperiDONE  (RISPERDAL ) 1 MG tablet Take 1 tablet (1 mg total) by mouth 2 (two) times daily. 04/21/15   Jacquetta Sharlot GRADE, NP  traZODone  (DESYREL ) 50 MG tablet Take 1 tablet (50 mg total) by mouth at bedtime as needed and may repeat dose one time if needed for sleep. 04/21/15   Jacquetta Sharlot GRADE, NP    Social History   Socioeconomic History   Marital status: Single    Spouse name: Not on file   Number of children: Not on file   Years of education: Not on file   Highest education level: Not on file  Occupational History   Not on file  Tobacco Use   Smoking status: Some Days    Current packs/day: 0.25    Types: Cigarettes   Smokeless tobacco: Never  Substance and Sexual Activity   Alcohol  use: No   Drug use: Not Currently    Comment: psdt histoyr. last use of crack cocaine  was last month ago.    Sexual activity: Not Currently    Birth control/protection: OCP, Condom  Other Topics Concern   Not on file  Social History Narrative   ** Merged History Encounter **       Social Drivers of Corporate Investment Banker Strain: Not on file  Food Insecurity: Not on file  Transportation Needs: Not on file  Physical Activity: Not on file  Stress: Not on file  Social Connections: Not on file  Intimate Partner Violence: Not on file     Family History  Problem Relation Age of Onset   Cancer Maternal Grandmother     ROS: Otherwise negative unless mentioned in HPI  Physical Examination  Vitals:   11/01/23 0500 11/01/23 0653  BP: 101/74   Pulse: 85   Resp: 18   Temp:  97.8 F (36.6 C)  SpO2: 96%    Body mass index is 36.02 kg/m.  General:  WDWN in NAD Gait: Not observed HENT: WNL, normocephalic Pulmonary: normal non-labored breathing, without Rales, rhonchi,  wheezing Cardiac: regular, without  Murmurs, rubs or gallops; without carotid bruits Abdomen: Positive bowel sounds throughout, soft, NT/ND, no masses Skin: without  rashes Vascular Exam/Pulses: Palpable pulses throughout, Extremities: without ischemic changes, without Gangrene , without cellulitis; without open wounds;  Musculoskeletal: no muscle wasting or atrophy  Neurologic: A&O X 3;  No focal weakness or paresthesias are detected; speech is fluent/normal Psychiatric:  The pt has Normal affect. Lymph:  Unremarkable  CBC    Component Value Date/Time   WBC 8.2 11/01/2023 0545   RBC 4.37 11/01/2023 0545   HGB 13.0 11/01/2023 0545   HCT 38.6 11/01/2023 0545   PLT 165 11/01/2023 0545   MCV 88.3 11/01/2023 0545   MCH 29.7 11/01/2023 0545   MCHC 33.7 11/01/2023 0545   RDW 12.1 11/01/2023 0545   LYMPHSABS 2.1 05/31/2013 1143   MONOABS 0.3 05/31/2013 1143   EOSABS 0.1 05/31/2013 1143   BASOSABS 0.0 05/31/2013 1143    BMET    Component Value Date/Time   NA 135 11/01/2023 0545   K 3.9 11/01/2023 0545   CL 105 11/01/2023 0545   CO2 20 (L) 11/01/2023 0545   GLUCOSE 93 11/01/2023 0545   BUN 9 11/01/2023 0545   CREATININE 0.86 11/01/2023 0545   CALCIUM 8.7 (L) 11/01/2023 0545   GFRNONAA >60 11/01/2023 0545   GFRAA >60 05/06/2015 1455    COAGS: Lab Results  Component Value Date   INR 1.1 10/31/2023     Non-Invasive Vascular Imaging:   EXAM:10/31/23 CT ANGIOGRAPHY CHEST WITH CONTRAST   TECHNIQUE: Multidetector CT imaging of the chest was performed using the standard protocol during bolus administration of intravenous contrast. Multiplanar CT image reconstructions and MIPs were obtained to evaluate the vascular anatomy.   RADIATION DOSE REDUCTION: This exam was performed according to the departmental dose-optimization program which includes automated exposure control, adjustment of the mA and/or kV according to patient size and/or use of iterative reconstruction technique.   CONTRAST:  OMNIPAQUE  IOHEXOL  350 MG/ML SOLN   COMPARISON:  None Available.   FINDINGS: Cardiovascular: Filling defects within the pulmonary  arteries bilaterally involving all lobes of both lungs compatible with acute pulmonary emboli. Heart is normal size. RV/LV ratio elevated at 1.44 compatible with right heart strain. Aorta normal caliber.   Mediastinum/Nodes: No mediastinal, hilar, or axillary adenopathy. Trachea and esophagus are unremarkable. Thyroid unremarkable. Small to moderate-sized hiatal hernia.   Lungs/Pleura: Lungs are clear. No focal airspace opacities or suspicious nodules. No effusions.   Upper Abdomen: No acute findings   Musculoskeletal: Chest wall soft tissues are unremarkable. No acute bony abnormality.   Review of the MIP images confirms the above findings.   IMPRESSION: Positive for acute PE with CT evidence of right heart strain (RV/LV Ratio = 1.44) consistent  with at least submassive (intermediate risk) PE. The presence of right heart strain has been associated with an increased risk of morbidity and mortality. Please refer to the Code PE Focused order set in EPIC.   Small to moderate-sized hiatal hernia.   Critical Value/emergent results were called by telephone at the time of interpretation on 10/31/2023 at 10:30 pm to provider [facility]ID [facility] , who verbally acknowledged these results.  Ultrasound bilateral lower extremities for DVTs ordered  Statin:  No. Beta Blocker:  No. Aspirin:  No. ACEI:  No. ARB:  No. CCB use:  No Other antiplatelets/anticoagulants:  No.    ASSESSMENT/PLAN: This is a 48 y.o. female with a history of schizophrenic bipolar disorder and substance abuse presents to [facility] emergency department after she started to have midsternal chest pain that was worsening with movement.  She reported no shortness of breath but did feel palpitations.  Upon workup here in the emergency department patient underwent CTA of the chest which reveals pulmonary embolisms with right heart strain at 1.44.  Patient's vitals all remained stable patient was started on a heparin   infusion.  Vascular surgery plans on taking the patient to the vascular lab later today on 11/01/2023 for a pulmonary thrombectomy.  I discussed in detail this morning with the patient the procedure, benefits, risk, and complications.  She verbalizes her understanding and wishes to proceed.  I answered all of her questions this morning.  Patient has been n.p.o. since midnight last night.   -I discussed the plan in detail with Dr. Selinda Gu MD and he agrees with the plan   Gwendlyn JONELLE Shank [facility] 11/01/2023 7:18 AM

## 2023-11-01 NOTE — ED Notes (Signed)
 [place] at the bedside

## 2023-11-01 NOTE — Assessment & Plan Note (Addendum)
 CTA chest with bilateral submassive PE with concern of right heart strain.  Current risk factor is only oral contraceptives. Vascular surgery was consulted and patient will be going for mechanical thrombectomy later today.  Remained on room air Lower extremity venous Doppler ordered Echocardiogram ordered Hypercoagulable workup ordered. -Continue with heparin  infusion for now

## 2023-11-01 NOTE — Interval H&P Note (Signed)
 History and Physical Interval Note:  11/01/2023 3:38 PM  Cassidy Thomas  has presented today for surgery, with the diagnosis of Pulmonary Embolism with right heart strain.  The various methods of treatment have been discussed with the patient and family. After consideration of risks, benefits and other options for treatment, the patient has consented to  Procedure(s): PULMONARY THROMBECTOMY (Bilateral) as a surgical intervention.  The patient's history has been reviewed, patient examined, no change in status, stable for surgery.  I have reviewed the patient's chart and labs.  Questions were answered to the patient's satisfaction.     Sharhonda Atwood

## 2023-11-01 NOTE — Assessment & Plan Note (Addendum)
 Mildly elevated troponin likely due to demand ischemia with submassive PE.  Chest pain currently resolved -Continue to monitor -Supportive care

## 2023-11-01 NOTE — Consult Note (Signed)
 PHARMACY - ANTICOAGULATION CONSULT NOTE  Pharmacy Consult for Heparin  Infusion Indication: PE  Allergies  Allergen Reactions   Albuterol Sulfate Other (See Comments)    Pt states it makes her have a bad feeling.    Other Other (See Comments)    Patient is allergic to eye drops causes eyes to swell   Patient Measurements: Height: 5' 7 (170.2 cm) Weight: 104.3 kg (230 lb) IBW/kg (Calculated) : 61.6 Heparin  Dosing Weight: 85.2 kg  Vital Signs: Temp: 98 F (36.7 C) (01/07 2000) Temp Source: Oral (01/07 2000) BP: 137/86 (01/07 2304) Pulse Rate: 103 (01/07 2304)  Labs: Recent Labs    10/31/23 1747 10/31/23 2020 10/31/23 2030 11/01/23 0545 11/01/23 1300 11/01/23 2253  HGB 13.7  --   --  13.0  --   --   HCT 41.2  --   --  38.6  --   --   PLT 182  --   --  165  --   --   APTT  --   --  27  --   --   --   LABPROT  --   --  13.9  --   --   --   INR  --   --  1.1  --   --   --   HEPARINUNFRC  --   --   --  0.28* 0.31 0.65  CREATININE 0.94  --   --  0.86  --   --   TROPONINIHS 195* 186*  --   --   --   --    Estimated Creatinine Clearance: 102.6 mL/min (by C-G formula based on SCr of 0.86 mg/dL).  Medical History: Past Medical History:  Diagnosis Date   Asthma    Bipolar 1 disorder (HCC)    Schizophrenia (HCC)    Substance abuse (HCC)    Baseline Labs Hgb 13.7. PLT 182  Medications:  Not on anticoagulation at home  Assessment: Patient is a 48 year old female who present from group home with chest pain. Troponin 195. CTA of the chest which reveals pulmonary embolisms with right heart strain. Monitor for vascular recommendations and transition oral anticoagulation. Pharmacy has been consulted to initiate patient on a heparin  infusion.    Goal of Therapy:  Heparin  level 0.3-0.7 units/ml Monitor platelets by anticoagulation protocol: Yes  Heparin  Levels Date/Time HL Clinical Assessment  1/7@0545  HL = 0.28 SUBtherapeutic  1/7 2253 0.65 Therapeutic x 1 after  restart               Plan:  Continue heparin  infusion at a rate of 1250 units/hr Recheck heparin  level w/ AM labs to confirm  Monitor CBC daily  Rankin CANDIE Dills, PharmD, [facility] 11/01/2023 11:23 PM

## 2023-11-01 NOTE — Progress Notes (Signed)
 Patient lives in a group home and has legal guardianship with [facility] and contact names and phone numbers per ED staff:  Montie Hopping, Case Worker office: [phone] and mobile: [phone] and Nat Duos, Social Worker office: [phone] or after hours [phone].

## 2023-11-01 NOTE — Assessment & Plan Note (Addendum)
 No concern of exacerbation at this time - Albuterol as needed.

## 2023-11-01 NOTE — Assessment & Plan Note (Addendum)
 Continue home meds

## 2023-11-01 NOTE — Hospital Course (Addendum)
 Cassidy Thomas is a 48 y.o. female with past medical history significant for bipolar 1 disorder and asthma presented to ED with acute onset of chest pain, increased with deep breathing and body movements, associated with palpitation but no shortness of breath.  Per report patient is on oral contraceptives.  On presentation she was mildly tachycardic, labs with elevated troponin at 195>>186, D-dimer 2.79. CTA with acute submassive PE with signs of right heart strain.  RV/LV ratio at 1.44.  EKG with sinus tach he, left axis deviation and no significant ST-T changes.  Patient was started on heparin  infusion and vascular surgery was consulted.  1/7: Vital stable, on room air, UDS negative, mild metabolic acidosis with bicarb at 20.  Vascular surgery is planning to take her to the OR for mechanical thrombectomy later today.  Lower extremity venous Doppler and echocardiogram ordered.  Her current risk at this time is just oral contraceptives, hypercoagulable labs ordered.  1/8: Vital stable, s/p mechanical thrombectomy and thrombolysis, estimated blood loss of about 450 cc.  Patient tolerated the procedure well.  Hemoglobin decreased to 10.9 this morning, starting on p.o. supplement. Echocardiogram with normal EF, grade 1 diastolic dysfunction and no other significant abnormality.  Heparin  is being switched with Eliquis , patient will need at least 3 months of anticoagulation.  Hypercoagulable labs are pending which can be discussed with her during follow-up appointment either with vascular surgery or primary care provider and then they can determine the duration of anticoagulation.  Home as it was discontinued to decrease the risk of bleeding.  Case was discussed with her legal guardian and the owner of her group home and they both understand.  Her current risk factors are oral contraceptives and smoking.  Counseling was again provided and she was also provided with nicotine  patch to help.  Patient will  continue the rest of her home medications and follow-up with her providers for further management.

## 2023-11-01 NOTE — Telephone Encounter (Signed)
 Pharmacy Patient Advocate Encounter  Insurance verification completed.    The patient is insured through [facility]. Patient has Medicare and is not eligible for a copay card, but may be able to apply for patient assistance or Medicare RX Payment Plan (Patient Must reach out to their plan, if eligible for payment plan), if available.    Ran test claim for Eliquis  and the current 30 day co-pay is $0.00.  Ran test claim for Xarelto and the current 30 day co-pay is $0.00.  This test claim was processed through [facility]- copay amounts may vary at other pharmacies due to pharmacy/plan contracts, or as the patient moves through the different stages of their insurance plan.

## 2023-11-01 NOTE — Op Note (Signed)
 Cassidy Thomas  Percutaneous Study/Intervention Procedural Note   Date of Surgery: 11/01/2023,4:27 PM  Surgeon: Selinda Gu  Pre-operative Diagnosis: Symptomatic bilateral pulmonary emboli with right heart strain  Post-operative diagnosis:  Same  Procedure(s) Performed:  1.  Contrast injection right heart  2.  Thrombolysis with 8 mg of tPA, 4 mg in each of the main pulmonary arteries  3.  Mechanical thrombectomy using the penumbra CAT 12 catheter to the right lower lobe, middle lobe, and upper lobe pulmonary arteries into the left lower lobe and upper lobe pulmonary arteries pulmonary arteries  4.  Selective catheter placement right lower lobe, middle lobe, and upper lobe pulmonary artery  5.  Selective catheter placement left lower lobe and upper lobe pulmonary artery    Anesthesia: Conscious sedation was administered under my direct supervision by the interventional radiology RN. IV Versed  plus fentanyl  were utilized. Continuous ECG, pulse oximetry and blood pressure was monitored throughout the entire procedure.  Versed  and fentanyl  were administered intravenously.  Conscious sedation was administered for a total of 40 minutes using 3 mg of Versed  and 75 mcg of Fentanyl .  EBL: 450 cc  Sheath: 14 French right femoral vein  Contrast: 65 cc   Fluoroscopy Time: 9.8 minutes  Indications:  Patient presents with pulmonary emboli. The patient is symptomatic with hypoxemia and dyspnea on exertion.  There is evidence of right heart strain on the CT angiogram. The patient is otherwise a good candidate for intervention and even the long-term benefits pulmonary angiography with thrombolysis is offered. The risks and benefits are reviewed long-term benefits are discussed. All questions are answered patient agrees to proceed.  Procedure:  Cassidy Thomas a 48 y.o. female who was identified and appropriate procedural time out was performed.  The patient was then placed supine  on the table and prepped and draped in the usual sterile fashion.  Ultrasound was used to evaluate the right common femoral vein.  It was patent, as it was echolucent and compressible.  A digital ultrasound image was acquired for the permanent record.  A Seldinger needle was used to access the right common femoral vein under direct ultrasound guidance.  A 0.035 J wire was advanced without resistance and a 6Fr sheath was placed. A proglide device was placed in a preclose fashion and then upsized to a 14 French sheath.    The Advantage wire and pigtail catheter were then negotiated into the right atrium and bolus injection of contrast was utilized to demonstrate the right ventricle and the pulmonary artery outflow. The Advantage wire and catheter were then negotiated into the the pulmonary arteries.  The left pulmonary artery was cannulated and advanced into the left upper lobe and left lower lobe for selective imaging. This demonstrated significant thrombus burden in both left upper left lower lobe pulmonary arteries.  I then transitioned to the right side with the advantage wire and catheter and first cannulated the right lower lobe and then the right middle and upper lobes.  This demonstrated significant thrombus burden in the right upper lobe, middle lobe, and lower lobe pulmonary arteries.  3000 Units of heparin  was then given and allowed to circulate.  TPA was reconstituted and delivered onto the table. A total of 8 milligrams of TPA was utilized.  4 mg was administered on the left side and 4 mg was administered on the right side. This was then allowed to dwell.  The Penumbra Cat 12 catheter was then advanced up into the pulmonary vasculature.  The right lung was addressed first. Catheter was negotiated into the right lower lobe pulmonary artery and mechanical thrombectomy was performed with the help of the separator. Follow-up imaging demonstrated a good result and therefore the catheter was renegotiated  into the right middle lobe pulmonary artery and again mechanical thrombectomy was performed. Passes were made with both the Penumbra catheter itself as well as introducing the separator.  Finally, I was able to negotiate the penumbra CAT 12 catheter up into the right upper lobe pulmonary artery and pulmonary thrombectomy was performed with the help of the separator.  Follow-up imaging was then performed.  Significant improvement was seen particularly in the middle and upper lobe pulmonary arteries.  The Penumbra Cat 12 catheter was then negotiated to the opposite side. The left lung was then addressed. Catheter was negotiated into the left lower lobe pulmonary artery and mechanical thrombectomy was performed with the separator. The catheter was renegotiated into the left upper lobe pulmonary artery and again mechanical thrombectomy was performed. Passes were made with both the Penumbra catheter itself as well as introducing the separator. Follow-up imaging was then performed.  Small amount of thrombus was residual in the left upper and lower lobe pulmonary arteries but there was significant improvement.  After review these images wires were reintroduced and the catheter is removed. Then, the sheath is then pulled, the proglide device is secured, a Monocryl skin suture was placed and pressure is held. A sterile dressing is placed.    Findings:   Right heart imaging:  Right atrium and right ventricle and the pulmonary outflow tract appears somewhat dilated.  Right lung:  This demonstrated significant thrombus burden in the right upper lobe, middle lobe, and lower lobe pulmonary arteries.  Left lung:  This demonstrated significant thrombus burden in both left upper left lower lobe pulmonary arteries.    Disposition: Patient was taken to the recovery room in stable condition having tolerated the procedure well.  Selinda Gu 11/01/2023,4:27 PM

## 2023-11-02 ENCOUNTER — Other Ambulatory Visit (HOSPITAL_COMMUNITY): Payer: Self-pay

## 2023-11-02 ENCOUNTER — Encounter: Payer: Self-pay | Admitting: Vascular Surgery

## 2023-11-02 ENCOUNTER — Other Ambulatory Visit: Payer: Self-pay

## 2023-11-02 DIAGNOSIS — R079 Chest pain, unspecified: Secondary | ICD-10-CM | POA: Diagnosis not present

## 2023-11-02 DIAGNOSIS — F319 Bipolar disorder, unspecified: Secondary | ICD-10-CM | POA: Diagnosis not present

## 2023-11-02 DIAGNOSIS — J4520 Mild intermittent asthma, uncomplicated: Secondary | ICD-10-CM | POA: Diagnosis not present

## 2023-11-02 DIAGNOSIS — I2609 Other pulmonary embolism with acute cor pulmonale: Secondary | ICD-10-CM | POA: Diagnosis not present

## 2023-11-02 LAB — CBC
HCT: 32.2 % — ABNORMAL LOW (ref 36.0–46.0)
Hemoglobin: 10.9 g/dL — ABNORMAL LOW (ref 12.0–15.0)
MCH: 29.1 pg (ref 26.0–34.0)
MCHC: 33.9 g/dL (ref 30.0–36.0)
MCV: 86.1 fL (ref 80.0–100.0)
Platelets: 144 10*3/uL — ABNORMAL LOW (ref 150–400)
RBC: 3.74 MIL/uL — ABNORMAL LOW (ref 3.87–5.11)
RDW: 11.9 % (ref 11.5–15.5)
WBC: 5.4 10*3/uL (ref 4.0–10.5)
nRBC: 0 % (ref 0.0–0.2)

## 2023-11-02 LAB — GLUCOSE, CAPILLARY: Glucose-Capillary: 91 mg/dL (ref 70–99)

## 2023-11-02 LAB — ECHOCARDIOGRAM COMPLETE
Area-P 1/2: 3.84 cm2
Height: 67 in
S' Lateral: 2.6 cm
Weight: 3680 [oz_av]

## 2023-11-02 LAB — ANTITHROMBIN III: AntiThromb III Func: 77 % (ref 75–120)

## 2023-11-02 LAB — HEMOGLOBIN A1C
Hgb A1c MFr Bld: 5.7 % — ABNORMAL HIGH (ref 4.8–5.6)
Mean Plasma Glucose: 117 mg/dL

## 2023-11-02 LAB — HEPARIN LEVEL (UNFRACTIONATED): Heparin Unfractionated: 0.72 [IU]/mL — ABNORMAL HIGH (ref 0.30–0.70)

## 2023-11-02 LAB — RPR: RPR Ser Ql: NONREACTIVE

## 2023-11-02 MED ORDER — FERROUS SULFATE 325 (65 FE) MG PO TABS
325.0000 mg | ORAL_TABLET | Freq: Two times a day (BID) | ORAL | 0 refills | Status: AC
  Filled 2023-11-02: qty 100, 50d supply, fill #0
  Filled 2023-11-02: qty 180, 90d supply, fill #0

## 2023-11-02 MED ORDER — APIXABAN (ELIQUIS) VTE STARTER PACK (10MG AND 5MG)
ORAL_TABLET | ORAL | 0 refills | Status: AC
  Filled 2023-11-02: qty 74, 30d supply, fill #0

## 2023-11-02 MED ORDER — FE FUM-VIT C-VIT B12-FA 460-60-0.01-1 MG PO CAPS
1.0000 | ORAL_CAPSULE | Freq: Two times a day (BID) | ORAL | Status: DC
  Administered 2023-11-02: 1 via ORAL
  Filled 2023-11-02: qty 1

## 2023-11-02 MED ORDER — NICOTINE 14 MG/24HR TD PT24
14.0000 mg | MEDICATED_PATCH | Freq: Every day | TRANSDERMAL | 0 refills | Status: AC
  Filled 2023-11-02 (×2): qty 28, 28d supply, fill #0

## 2023-11-02 MED ORDER — APIXABAN 5 MG PO TABS
10.0000 mg | ORAL_TABLET | Freq: Two times a day (BID) | ORAL | Status: DC
  Administered 2023-11-02: 10 mg via ORAL
  Filled 2023-11-02: qty 2

## 2023-11-02 MED ORDER — APIXABAN 5 MG PO TABS: 5.0000 mg | ORAL_TABLET | Freq: Two times a day (BID) | ORAL | Status: DC

## 2023-11-02 MED ORDER — APIXABAN 5 MG PO TABS
ORAL_TABLET | ORAL | 0 refills | Status: AC
  Filled 2023-11-02: qty 208, 97d supply, fill #0

## 2023-11-02 NOTE — Progress Notes (Signed)
  Progress Note    11/02/2023 9:38 AM 1 Day Post-Op  Subjective:  Cassidy Thomas is a 48 yo female now POD #1 from pulmonary thrombectomy.  Patient is resting comfortably in bed this morning in ICU.  Normal respiratory effort and patient is on room air with oxygen saturations at 97%.  Patient denies any chest pain or shortness of breath while ambulating.  On exam this morning patient's lungs are clear throughout on auscultation.  No rales rhonchi or wheezing noted.  Patient's right groin puncture site is clean dry and intact with a dressing.  Patient instructed she can remove after showering tomorrow and replace with a Band-Aid for the next 3 days.  No complaints overnight and vitals are remained stable.   Vitals:   11/02/23 0700 11/02/23 0800  BP: 118/80 120/76  Pulse: 73 73  Resp: 18 19  Temp:  97.8 F (36.6 C)  SpO2: 99% 99%   Physical Exam: Cardiac:  RRR, no rubs clicks or gallops.  No murmurs appreciated. Lungs: Clear on auscultation throughout, no rales rhonchi or wheezing noted.  Normal respiratory effort.  On room air oxygen saturation 97% Incisions: Right groin puncture site with dressing clean dry and intact no hematoma seroma to note. Extremities: Bilateral lower extremities with palpable pulses and warm to touch. Abdomen: Positive bowel sounds throughout, soft, nontender and nondistended. Neurologic: Alert and oriented x 3, answers all questions and follows commands appropriately.  CBC    Component Value Date/Time   WBC 5.4 11/02/2023 0608   RBC 3.74 (L) 11/02/2023 0608   HGB 10.9 (L) 11/02/2023 0608   HCT 32.2 (L) 11/02/2023 0608   PLT 144 (L) 11/02/2023 0608   MCV 86.1 11/02/2023 0608   MCH 29.1 11/02/2023 0608   MCHC 33.9 11/02/2023 0608   RDW 11.9 11/02/2023 0608   LYMPHSABS 2.1 05/31/2013 1143   MONOABS 0.3 05/31/2013 1143   EOSABS 0.1 05/31/2013 1143   BASOSABS 0.0 05/31/2013 1143    BMET    Component Value Date/Time   NA 135 11/01/2023 0545   K 3.9  11/01/2023 0545   CL 105 11/01/2023 0545   CO2 20 (L) 11/01/2023 0545   GLUCOSE 93 11/01/2023 0545   BUN 9 11/01/2023 0545   CREATININE 0.86 11/01/2023 0545   CALCIUM 8.7 (L) 11/01/2023 0545   GFRNONAA >60 11/01/2023 0545   GFRAA >60 05/06/2015 1455    INR    Component Value Date/Time   INR 1.1 10/31/2023 2030     Intake/Output Summary (Last 24 hours) at 11/02/2023 9061 Last data filed at 11/02/2023 0900 Gross per 24 hour  Intake 1479.17 ml  Output 450 ml  Net 1029.17 ml     Assessment/Plan:  48 y.o. female is s/p pulmonary thrombectomy 1 Day Post-Op   Plan Stop heparin  infusion. Start patient on Eliquis  10 mg twice daily for 7 days then convert to 5 mg twice daily indefinitely. Okay per vascular surgery for patient to be discharged home once converted to oral anticoagulation. Patient to follow-up with vascular surgery in 1 month as scheduled.  DVT prophylaxis: Heparin  infusion converted to oral anticoagulation Eliquis  10 mg twice daily.   Cassidy Thomas [facility] 11/02/2023 9:38 AM

## 2023-11-02 NOTE — NC FL2 (Signed)
 [place]  MEDICAID FL2 LEVEL OF CARE FORM     IDENTIFICATION  Patient Name: Cassidy Thomas Birthdate: February 17, 1976 Sex: female Admission Date (Current Location): 10/31/2023  [facility] and [place] Number:  Chiropodist and Address:  [facility], [address] [place], [place] [address]      Provider Number: [id number]  Attending Physician Name and Address:  Caleen Qualia, MD  Relative Name and Phone Number:       Current Level of Care: Hospital Recommended Level of Care: Family Care Home Prior Approval Number:    Date Approved/Denied:   PASRR Number:    Discharge Plan: Other (Comment) (Family Care Home)    Current Diagnoses: Patient Active Problem List   Diagnosis Date Noted   Chest pain 11/01/2023   Pulmonary embolism (HCC) 11/01/2023   Bipolar 1 disorder (HCC)    Asthma    Schizoaffective disorder (HCC) 01/14/2014   Intellectual disability 01/14/2014   AMA (advanced maternal age) multigravida 35+ 05/31/2013    Orientation RESPIRATION BLADDER Height & Weight     Self, Time, Situation, Place  Normal Continent Weight: 230 lb (104.3 kg) Height:  5' 7 (170.2 cm)  BEHAVIORAL SYMPTOMS/MOOD NEUROLOGICAL BOWEL NUTRITION STATUS   (None)  (None) Continent Diet (Low sodium, heart healthy)  AMBULATORY STATUS COMMUNICATION OF NEEDS Skin     Verbally Normal                       Personal Care Assistance Level of Assistance              Functional Limitations Info  Sight, Hearing, Speech Sight Info: Adequate Hearing Info: Adequate Speech Info: Adequate    SPECIAL CARE FACTORS FREQUENCY                       Contractures Contractures Info: Not present    Additional Factors Info  Code Status, Allergies Code Status Info: Full code Allergies Info: Albuterol Sulfate, Eye drops           Current Medications (11/02/2023):  This is the current hospital active medication list Current Facility-Administered Medications   Medication Dose Route Frequency Provider Last Rate Last Admin   acetaminophen  (TYLENOL ) tablet 650 mg  650 mg Oral Q6H PRN Dew, Jason S, MD       Or   acetaminophen  (TYLENOL ) suppository 650 mg  650 mg Rectal Q6H PRN Dew, Jason S, MD       apixaban  (ELIQUIS ) tablet 10 mg  10 mg Oral BID Pace, Brien R, NP   10 mg at 11/02/23 1030   Followed by   NOREEN ON 11/09/2023] apixaban  (ELIQUIS ) tablet 5 mg  5 mg Oral BID Pace, Brien R, NP       Chlorhexidine  Gluconate Cloth 2 % PADS 6 each  6 each Topical Daily Amin, Sumayya, MD   6 each at 11/02/23 0815   famotidine  (PEPCID ) tablet 40 mg  40 mg Oral QHS Dew, Jason S, MD   40 mg at 11/01/23 2110   Fe Fum-Vit C-Vit B12-FA (TRIGELS-F FORTE) capsule 1 capsule  1 capsule Oral BID Amin, Sumayya, MD   1 capsule at 11/02/23 1030   fluticasone  furoate-vilanterol (BREO ELLIPTA ) 200-25 MCG/ACT 1 puff  1 puff Inhalation Daily Marea Selinda RAMAN, MD   1 puff at 11/02/23 0815   hydrOXYzine  (ATARAX ) tablet 25 mg  25 mg Oral QID PRN Dew, Jason S, MD  melatonin tablet 2.5 mg  2.5 mg Oral QHS Dew, Jason S, MD   2.5 mg at 11/01/23 2110   morphine  (PF) 2 MG/ML injection 2 mg  2 mg Intravenous Q4H PRN Dew, Jason S, MD       nicotine  (NICODERM CQ  - dosed in mg/24 hours) patch 14 mg  14 mg Transdermal Daily Dew, Jason S, MD   14 mg at 11/02/23 0815   nitroGLYCERIN  (NITROSTAT ) SL tablet 0.4 mg  0.4 mg Sublingual Q5 min PRN Marea Selinda RAMAN, MD       OLANZapine  (ZYPREXA ) tablet 10 mg  10 mg Oral QHS Dew, Jason S, MD   10 mg at 11/01/23 2111   simvastatin  (ZOCOR ) tablet 20 mg  20 mg Oral QHS Dew, Jason S, MD   20 mg at 11/01/23 2111   sodium chloride  flush (NS) 0.9 % injection 3 mL  3 mL Intravenous Q12H Dew, Jason S, MD   3 mL at 11/02/23 0815   traZODone  (DESYREL ) tablet 50 mg  50 mg Oral QHS PRN,MR X 1 Dew, Jason S, MD         Discharge Medications: STOP taking these medications     aspirin EC 81 MG tablet           TAKE these medications     Apixaban  Starter Pack  (10mg  and 5mg ) Commonly known as: ELIQUIS  STARTER PACK Take as directed on package: start with two-5mg  tablets twice daily for 7 days. On day 8, switch to one-5mg  tablet twice daily.    apixaban  5 MG Tabs tablet Commonly known as: ELIQUIS  Take 2 tablets (10 mg total) by mouth 2 (two) times daily for 7 days, THEN 1 tablet (5 mg total) 2 (two) times daily. Start taking on: November 02, 2023    budesonide-formoterol 160-4.5 MCG/ACT inhaler Commonly known as: SYMBICORT Inhale 2 puffs into the lungs 2 (two) times daily.    famotidine  40 MG tablet Commonly known as: PEPCID  Take 40 mg by mouth at bedtime.    Fe Fum-Vit C-Vit B12-FA Caps capsule Commonly known as: TRIGELS-F FORTE Take 1 capsule by mouth 2 (two) times daily.    hydrOXYzine  25 MG capsule Commonly known as: VISTARIL  Take 25 mg by mouth 4 (four) times daily as needed for anxiety.    melatonin 3 MG Tabs tablet Take 3 mg by mouth at bedtime.    nicotine  14 mg/24hr patch Commonly known as: NICODERM CQ  - dosed in mg/24 hours Place 1 patch (14 mg total) onto the skin daily. Start taking on: November 03, 2023    nystatin powder Commonly known as: MYCOSTATIN/NYSTOP Apply 1 Application topically 2 (two) times daily.    OLANZapine  10 MG tablet Commonly known as: ZYPREXA  Take 10 mg by mouth at bedtime.    simvastatin  20 MG tablet Commonly known as: ZOCOR  Take 20 mg by mouth at bedtime.    Vitamin D (Ergocalciferol) 1.25 MG (50000 UNIT) Caps capsule Commonly known as: DRISDOL Take 50,000 Units by mouth every 7 (seven) days.    Relevant Imaging Results:  Relevant Lab Results:   Additional Information SS#: [id number]  Lauraine JAYSON Carpen, LCSW

## 2023-11-02 NOTE — TOC Initial Note (Signed)
 Transition of Care [facility]) - Initial/Assessment Note    Patient Details  Name: Cassidy Thomas MRN: [id number] Date of Birth: 29-Sep-1976  Transition of Care [facility]) CM/SW Contact:    Cassidy JAYSON Carpen, Cassidy Thomas Phone Number: 11/02/2023, 10:24 AM  Clinical Narrative:  Patient's legal guardian is Cassidy Thomas. Her assigned social worker is Cassidy Thomas. CSW called Ms. Monroe who stated that patient lives at [facility]. CSW notified her of potential discharge back to the facility today. CSW called group home owner, Cassidy Thomas. She will plan to pick patient up at discharge.                 Expected Discharge Plan: Group Home Barriers to Discharge: No Barriers Identified   Patient Goals and CMS Choice            Expected Discharge Plan and Services     Post Acute Care Choice: NA Living arrangements for the past 2 months: Group Home                                      Prior Living Arrangements/Services Living arrangements for the past 2 months: Group Home Lives with:: Facility Resident Patient language and need for interpreter reviewed:: Yes        Need for Family Participation in Patient Care: Yes (Comment) Care giver support system in place?: Yes (comment)   Criminal Activity/Legal Involvement Pertinent to Current Situation/Hospitalization: No - Comment as needed  Activities of Daily Living      Permission Sought/Granted Permission sought to share information with : Facility Medical Sales Representative, Guardian    Share Information with NAME: Cassidy Thomas)  Permission granted to share info w AGENCY: [facility]  Permission granted to share info w Relationship: Legal Guardian  Permission granted to share info w Contact Information: [phone]  Emotional Assessment Appearance:: Appears stated age     Orientation: : Oriented to Self, Oriented to Place, Oriented to  Time, Oriented to Situation Alcohol  / Substance  Use: Not Applicable Psych Involvement: No (comment)  Admission diagnosis:  Chest pain [R07.9] Acute pulmonary embolism with acute cor pulmonale, unspecified pulmonary embolism type (HCC) [I26.09] Patient Active Problem List   Diagnosis Date Noted   Chest pain 11/01/2023   Pulmonary embolism (HCC) 11/01/2023   Bipolar 1 disorder (HCC)    Asthma    Schizoaffective disorder (HCC) 01/14/2014   Intellectual disability 01/14/2014   AMA (advanced maternal age) multigravida 35+ 05/31/2013   PCP:  Cassidy Channing SQUIBB, FNP Pharmacy:   [facility], [place] - [address] [place] [address] Phone: [phone] Fax: [phone]  [facility] [address] [place] [address] Phone: [phone] Fax: [phone]  [id number] [place], [place] - [address] AT [facility] [address] [place] [address] Phone: [phone] Fax: [phone]  [facility] - [place], [place] - [address] [place] [address] Phone: [phone] Fax: [phone]     Social Drivers of Health (SDOH) Social History: SDOH Screenings   Tobacco Use: High Risk (07/05/2023)   SDOH Interventions:     Readmission Risk Interventions     No data to display

## 2023-11-02 NOTE — Progress Notes (Signed)
 Pt discharged to group home. VSS. Discharge instructions discussed with pt and care taker from group home. All questions answered at this time.

## 2023-11-02 NOTE — TOC Transition Note (Signed)
 Transition of Care [facility]) - Discharge Note   Patient Details  Name: Cassidy Thomas MRN: [id number] Date of Birth: 1975-11-08  [facility]) CM/SW Contact:  Lauraine JAYSON Carpen, LCSW Phone Number: 11/02/2023, 12:00 PM   Clinical Narrative:   Patient has orders to discharge back to [facility] today. CSW printed off FL2 and dc summary to be given to Ms. Dannial. No further concerns. CSW signing off.  Final next level of care: Group Home Barriers to Discharge: Barriers Resolved   Patient Goals and CMS Choice            Discharge Placement                Patient to be transferred to facility by: Group home owner Name of family member notified: [facility] Patient and family notified of of transfer: 11/02/23  Discharge Plan and Services Additional resources added to the After Visit Summary for Autism/IDD     Post Acute Care Choice: NA                               Social Drivers of Health (SDOH) Interventions SDOH Screenings   Tobacco Use: High Risk (07/05/2023)     Readmission Risk Interventions     No data to display

## 2023-11-02 NOTE — Assessment & Plan Note (Signed)
 CTA chest with bilateral submassive PE with concern of right heart strain.  Current risk factor is oral contraceptives and smoking.  Lower extremity venous Doppler was negative for DVT in either extremity.  Echocardiogram with normal EF and grade 1 diastolic dysfunction, no other significant abnormality. S/p mechanical thrombectomy and thrombolysis with vascular surgery, estimated blood loss of about 450 cc.  Patient tolerated the procedure well Hypercoagulable workup done with pending results -Heparin  infusion switched with Eliquis 

## 2023-11-02 NOTE — Discharge Summary (Signed)
 Physician Discharge Summary   Patient: Cassidy Thomas MRN: [id number] DOB: 09-Feb-1976  Admit date:     10/31/2023  Discharge date: 11/02/23  Discharge Physician: Amaryllis Dare   PCP: Donal Channing SQUIBB, FNP   Recommendations at discharge:  Please obtain CBC and BMP on follow-up Hypercoagulable lab results are pending-please review them and then determine the duration of anticoagulation Patient is currently being discharged on Eliquis  Follow-up with primary care provider Follow-up with vascular surgery  Discharge Diagnoses: Principal Problem:   Pulmonary embolism [facility]) Active Problems:   Chest pain   Asthma   Bipolar 1 disorder [facility])   Hospital Course: Cassidy Thomas is a 48 y.o. female with past medical history significant for bipolar 1 disorder and asthma presented to ED with acute onset of chest pain, increased with deep breathing and body movements, associated with palpitation but no shortness of breath.  Per report patient is on oral contraceptives.  On presentation she was mildly tachycardic, labs with elevated troponin at 195>>186, D-dimer 2.79. CTA with acute submassive PE with signs of right heart strain.  RV/LV ratio at 1.44.  EKG with sinus tach he, left axis deviation and no significant ST-T changes.  Patient was started on heparin  infusion and vascular surgery was consulted.  1/7: Vital stable, on room air, UDS negative, mild metabolic acidosis with bicarb at 20.  Vascular surgery is planning to take her to the OR for mechanical thrombectomy later today.  Lower extremity venous Doppler and echocardiogram ordered.  Her current risk at this time is just oral contraceptives, hypercoagulable labs ordered.  1/8: Vital stable, s/p mechanical thrombectomy and thrombolysis, estimated blood loss of about 450 cc.  Patient tolerated the procedure well.  Hemoglobin decreased to 10.9 this morning, starting on p.o. supplement. Echocardiogram with normal EF, grade 1 diastolic dysfunction  and no other significant abnormality.  Heparin  is being switched with Eliquis , patient will need at least 3 months of anticoagulation.  Hypercoagulable labs are pending which can be discussed with her during follow-up appointment either with vascular surgery or primary care provider and then they can determine the duration of anticoagulation.  Home as it was discontinued to decrease the risk of bleeding.  Case was discussed with her legal guardian and the owner of her group home and they both understand.  Her current risk factors are oral contraceptives and smoking.  Counseling was again provided and she was also provided with nicotine  patch to help.  Patient will continue the rest of her home medications and follow-up with her providers for further management.  Assessment and Plan: * Pulmonary embolism (HCC) CTA chest with bilateral submassive PE with concern of right heart strain.  Current risk factor is oral contraceptives and smoking.  Lower extremity venous Doppler was negative for DVT in either extremity.  Echocardiogram with normal EF and grade 1 diastolic dysfunction, no other significant abnormality. S/p mechanical thrombectomy and thrombolysis with vascular surgery, estimated blood loss of about 450 cc.  Patient tolerated the procedure well Hypercoagulable workup done with pending results -Heparin  infusion switched with Eliquis    Chest pain Mildly elevated troponin likely due to demand ischemia with submassive PE.  Chest pain currently resolved -Continue to monitor -Supportive care  Asthma No concern of exacerbation at this time - Albuterol as needed.   Bipolar 1 disorder (HCC) -Continue home meds.   Consultants: Vascular surgery Procedures performed: Mechanical thrombectomy and thrombolysis of bilateral pulmonary embolism Disposition: Group home Diet recommendation:  Discharge Diet Orders (From admission, onward)  Start     Ordered   11/02/23 0000  Diet - low sodium  heart healthy        11/02/23 1102           Cardiac diet DISCHARGE MEDICATION: Allergies as of 11/02/2023       Reactions   Albuterol Sulfate Other (See Comments)   Pt states it makes her have a bad feeling.    Other Other (See Comments)   Patient is allergic to eye drops causes eyes to swell        Medication List     STOP taking these medications    aspirin EC 81 MG tablet       TAKE these medications    Apixaban  Starter Pack (10mg  and 5mg ) Commonly known as: ELIQUIS  STARTER PACK Take as directed on package: start with two-5mg  tablets twice daily for 7 days. On day 8, switch to one-5mg  tablet twice daily.   apixaban  5 MG Tabs tablet Commonly known as: ELIQUIS  Take 2 tablets (10 mg total) by mouth 2 (two) times daily for 7 days, THEN 1 tablet (5 mg total) 2 (two) times daily. Start taking on: November 02, 2023   budesonide-formoterol 160-4.5 MCG/ACT inhaler Commonly known as: SYMBICORT Inhale 2 puffs into the lungs 2 (two) times daily.   famotidine  40 MG tablet Commonly known as: PEPCID  Take 40 mg by mouth at bedtime.   Fe Fum-Vit C-Vit B12-FA Caps capsule Commonly known as: TRIGELS-F FORTE Take 1 capsule by mouth 2 (two) times daily.   hydrOXYzine  25 MG capsule Commonly known as: VISTARIL  Take 25 mg by mouth 4 (four) times daily as needed for anxiety.   melatonin 3 MG Tabs tablet Take 3 mg by mouth at bedtime.   nicotine  14 mg/24hr patch Commonly known as: NICODERM CQ  - dosed in mg/24 hours Place 1 patch (14 mg total) onto the skin daily. Start taking on: November 03, 2023   nystatin powder Commonly known as: MYCOSTATIN/NYSTOP Apply 1 Application topically 2 (two) times daily.   OLANZapine  10 MG tablet Commonly known as: ZYPREXA  Take 10 mg by mouth at bedtime.   simvastatin  20 MG tablet Commonly known as: ZOCOR  Take 20 mg by mouth at bedtime.   Vitamin D (Ergocalciferol) 1.25 MG (50000 UNIT) Caps capsule Commonly known as: DRISDOL Take  50,000 Units by mouth every 7 (seven) days.        Follow-up Information     Donal Channing SQUIBB, FNP. Schedule an appointment as soon as possible for a visit in 1 week(s).   Specialty: Family Medicine Contact information: [address] [place] [address] [phone]         Marea Selinda RAMAN, MD. Schedule an appointment as soon as possible for a visit in 1 week(s).   Specialties: Vascular Surgery, Radiology, Interventional Cardiology Contact information: [address] [place] [address] [phone]                Discharge Exam: Fredricka Weights   10/31/23 1745  Weight: 104.3 kg   General.  Obese lady, in no acute distress. Pulmonary.  Lungs clear bilaterally, normal respiratory effort. CV.  Regular rate and rhythm, no JVD, rub or murmur. Abdomen.  Soft, nontender, nondistended, BS positive. CNS.  Alert and oriented .  No focal neurologic deficit. Extremities.  No edema, no cyanosis, pulses intact and symmetrical.  Condition at discharge: stable  The results of significant diagnostics from this hospitalization (including imaging, microbiology, ancillary and laboratory) are  listed below for reference.   Imaging Studies: ECHOCARDIOGRAM COMPLETE Result Date: 11/02/2023    ECHOCARDIOGRAM REPORT   Patient Name:   JESSICA CHECKETTS Date of Exam: 11/01/2023 Medical Rec #:  [id number]       Height:       67.0 in Accession #:    [id number]      Weight:       230.0 lb Date of Birth:  1976-02-28        BSA:          2.146 m Patient Age:    47 years        BP:           101/74 mmHg Patient Gender: F               HR:           70 bpm. Exam Location:  [facility] Procedure: 2D Echo, Cardiac Doppler and Color Doppler Indications:     R07.9 Chest pain  History:         Patient has no prior history of Echocardiogram examinations.  Sonographer:     Carl Coma RDCS Referring Phys:  [id number] SELINDA RAMAN DEW Diagnosing Phys: Marsa Dooms MD IMPRESSIONS  1. Left  ventricular ejection fraction, by estimation, is 55 to 60%. The left ventricle has normal function. The left ventricle has no regional wall motion abnormalities. Left ventricular diastolic parameters are consistent with Grade I diastolic dysfunction (impaired relaxation).  2. Right ventricular systolic function is normal. The right ventricular size is normal.  3. The mitral valve is normal in structure. Trivial mitral valve regurgitation. No evidence of mitral stenosis.  4. The aortic valve is normal in structure. Aortic valve regurgitation is not visualized. No aortic stenosis is present.  5. The inferior vena cava is normal in size with greater than 50% respiratory variability, suggesting right atrial pressure of 3 mmHg. FINDINGS  Left Ventricle: Left ventricular ejection fraction, by estimation, is 55 to 60%. The left ventricle has normal function. The left ventricle has no regional wall motion abnormalities. The left ventricular internal cavity size was normal in size. There is  no left ventricular hypertrophy. Left ventricular diastolic parameters are consistent with Grade I diastolic dysfunction (impaired relaxation). Right Ventricle: The right ventricular size is normal. No increase in right ventricular wall thickness. Right ventricular systolic function is normal. Left Atrium: Left atrial size was normal in size. Right Atrium: Right atrial size was normal in size. Pericardium: There is no evidence of pericardial effusion. Mitral Valve: The mitral valve is normal in structure. Trivial mitral valve regurgitation. No evidence of mitral valve stenosis. Tricuspid Valve: The tricuspid valve is normal in structure. Tricuspid valve regurgitation is trivial. No evidence of tricuspid stenosis. Aortic Valve: The aortic valve is normal in structure. Aortic valve regurgitation is not visualized. No aortic stenosis is present. Pulmonic Valve: The pulmonic valve was normal in structure. Pulmonic valve regurgitation is not  visualized. No evidence of pulmonic stenosis. Aorta: The aortic root is normal in size and structure. Venous: The inferior vena cava is normal in size with greater than 50% respiratory variability, suggesting right atrial pressure of 3 mmHg. IAS/Shunts: No atrial level shunt detected by color flow Doppler.  LEFT VENTRICLE PLAX 2D LVIDd:         3.70 cm   Diastology LVIDs:         2.60 cm   LV e' medial:    10.26 cm/s LV PW:  1.10 cm   LV E/e' medial:  6.3 LV IVS:        1.10 cm   LV e' lateral:   14.53 cm/s LVOT diam:     1.70 cm   LV E/e' lateral: 4.4 LV SV:         43 LV SV Index:   20 LVOT Area:     2.27 cm  RIGHT VENTRICLE             IVC RV Basal diam:  4.10 cm     IVC diam: 1.70 cm RV S prime:     13.90 cm/s TAPSE (M-mode): 2.2 cm LEFT ATRIUM             Index        RIGHT ATRIUM           Index LA diam:        3.70 cm 1.72 cm/m   RA Area:     11.40 cm LA Vol (A2C):   31.7 ml 14.77 ml/m  RA Volume:   28.90 ml  13.47 ml/m LA Vol (A4C):   39.1 ml 18.22 ml/m LA Biplane Vol: 38.4 ml 17.89 ml/m  AORTIC VALVE LVOT Vmax:   109.50 cm/s LVOT Vmean:  68.400 cm/s LVOT VTI:    0.188 m  AORTA Ao Root diam: 3.20 cm Ao Asc diam:  3.00 cm MITRAL VALVE               TRICUSPID VALVE MV Area (PHT): 3.84 cm    TR Peak grad:   19.4 mmHg MV Decel Time: 198 msec    TR Vmax:        220.00 cm/s MV E velocity: 64.20 cm/s MV A velocity: 89.90 cm/s  SHUNTS MV E/A ratio:  0.71        Systemic VTI:  0.19 m                            Systemic Diam: 1.70 cm Marsa Dooms MD Electronically signed by Marsa Dooms MD Signature Date/Time: 11/02/2023/8:09:21 AM    Final    PERIPHERAL VASCULAR CATHETERIZATION Result Date: 11/01/2023 See surgical note for result.  US  Venous Img Lower Bilateral (DVT) Result Date: 11/01/2023 CLINICAL DATA:  Acute pulmonary embolism. EXAM: BILATERAL LOWER EXTREMITY VENOUS DOPPLER ULTRASOUND TECHNIQUE: Gray-scale sonography with graded compression, as well as color Doppler and duplex  ultrasound were performed to evaluate the lower extremity deep venous systems from the level of the common femoral vein and including the common femoral, femoral, profunda femoral, popliteal and calf veins including the posterior tibial, peroneal and gastrocnemius veins when visible. The superficial great saphenous vein was also interrogated. Spectral Doppler was utilized to evaluate flow at rest and with distal augmentation maneuvers in the common femoral, femoral and popliteal veins. COMPARISON:  None Available. FINDINGS: RIGHT LOWER EXTREMITY Common Femoral Vein: No evidence of thrombus. Normal compressibility, respiratory phasicity and response to augmentation. Saphenofemoral Junction: No evidence of thrombus. Normal compressibility and flow on color Doppler imaging. Profunda Femoral Vein: No evidence of thrombus. Normal compressibility and flow on color Doppler imaging. Femoral Vein: No evidence of thrombus. Normal compressibility, respiratory phasicity and response to augmentation. Popliteal Vein: No evidence of thrombus. Normal compressibility, respiratory phasicity and response to augmentation. Calf Veins: No evidence of thrombus. Normal compressibility and flow on color Doppler imaging. Superficial Great Saphenous Vein: No evidence of thrombus. Normal compressibility. Venous Reflux:  None. Other Findings: No  evidence of superficial thrombophlebitis or abnormal fluid collection. LEFT LOWER EXTREMITY Common Femoral Vein: No evidence of thrombus. Normal compressibility, respiratory phasicity and response to augmentation. Saphenofemoral Junction: No evidence of thrombus. Normal compressibility and flow on color Doppler imaging. Profunda Femoral Vein: No evidence of thrombus. Normal compressibility and flow on color Doppler imaging. Femoral Vein: No evidence of thrombus. Normal compressibility, respiratory phasicity and response to augmentation. Popliteal Vein: No evidence of thrombus. Normal compressibility,  respiratory phasicity and response to augmentation. Calf Veins: No evidence of thrombus. Normal compressibility and flow on color Doppler imaging. Superficial Great Saphenous Vein: No evidence of thrombus. Normal compressibility. Venous Reflux:  None. Other Findings: No evidence of superficial thrombophlebitis or abnormal fluid collection. IMPRESSION: No evidence of deep venous thrombosis in either lower extremity. Electronically Signed   By: Marcey Moan M.D.   On: 11/01/2023 13:38   CT Angio Chest PE W and/or Wo Contrast Result Date: 10/31/2023 CLINICAL DATA:  Midline chest pain, elevated troponin, D-dimer elevated. Evaluating for PE EXAM: CT ANGIOGRAPHY CHEST WITH CONTRAST TECHNIQUE: Multidetector CT imaging of the chest was performed using the standard protocol during bolus administration of intravenous contrast. Multiplanar CT image reconstructions and MIPs were obtained to evaluate the vascular anatomy. RADIATION DOSE REDUCTION: This exam was performed according to the departmental dose-optimization program which includes automated exposure control, adjustment of the mA and/or kV according to patient size and/or use of iterative reconstruction technique. CONTRAST:  OMNIPAQUE  IOHEXOL  350 MG/ML SOLN COMPARISON:  None Available. FINDINGS: Cardiovascular: Filling defects within the pulmonary arteries bilaterally involving all lobes of both lungs compatible with acute pulmonary emboli. Heart is normal size. RV/LV ratio elevated at 1.44 compatible with right heart strain. Aorta normal caliber. Mediastinum/Nodes: No mediastinal, hilar, or axillary adenopathy. Trachea and esophagus are unremarkable. Thyroid unremarkable. Small to moderate-sized hiatal hernia. Lungs/Pleura: Lungs are clear. No focal airspace opacities or suspicious nodules. No effusions. Upper Abdomen: No acute findings Musculoskeletal: Chest wall soft tissues are unremarkable. No acute bony abnormality. Review of the MIP images confirms the  above findings. IMPRESSION: Positive for acute PE with CT evidence of right heart strain (RV/LV Ratio = 1.44) consistent with at least submassive (intermediate risk) PE. The presence of right heart strain has been associated with an increased risk of morbidity and mortality. Please refer to the Code PE Focused order set in EPIC. Small to moderate-sized hiatal hernia. Critical Value/emergent results were called by telephone at the time of interpretation on 10/31/2023 at 10:30 pm to provider DAVID [facility] , who verbally acknowledged these results. Electronically Signed   By: Franky Crease M.D.   On: 10/31/2023 22:32   DG Chest 2 View Result Date: 10/31/2023 CLINICAL DATA:  Chest pain EXAM: CHEST - 2 VIEW COMPARISON:  None Available. FINDINGS: No consolidation, pneumothorax or effusion. No edema. Normal cardiopericardial silhouette. Frontal view is slightly rotated to the left. Overlapping gown snaps. IMPRESSION: No acute cardiopulmonary disease. Electronically Signed   By: Ranell Bring M.D.   On: 10/31/2023 18:21    Microbiology: Results for orders placed or performed during the hospital encounter of 10/31/23  MRSA Next Gen by PCR, Nasal     Status: None   Collection Time: 11/01/23  5:29 PM   Specimen: Nasal Mucosa; Nasal Swab  Result Value Ref Range Status   MRSA by PCR Next Gen NOT DETECTED NOT DETECTED Final    Comment: (NOTE) The GeneXpert MRSA Assay (FDA approved for NASAL specimens only), is one component of a comprehensive MRSA colonization  surveillance program. It is not intended to diagnose MRSA infection nor to guide or monitor treatment for MRSA infections. Test performance is not FDA approved in patients less than 87 years old. Performed at [facility], [address] [place], [place] [address]     Labs: CBC: Recent Labs  Lab 10/31/23 1747 11/01/23 0545 11/02/23 0608  WBC 9.2 8.2 5.4  HGB 13.7 13.0 10.9*  HCT 41.2 38.6 32.2*  MCV 89.2 88.3 86.1  PLT 182 165 144*    Basic Metabolic Panel: Recent Labs  Lab 10/31/23 1747 11/01/23 0545  NA 137 135  K 3.9 3.9  CL 106 105  CO2 22 20*  GLUCOSE 117* 93  BUN 10 9  CREATININE 0.94 0.86  CALCIUM 9.1 8.7*   Liver Function Tests: Recent Labs  Lab 10/31/23 2020 11/01/23 0545  AST 18 17  ALT 13 12  ALKPHOS 97 88  BILITOT 0.3 0.5  PROT 7.1 6.8  ALBUMIN 3.1* 3.0*   CBG: Recent Labs  Lab 11/01/23 1725  GLUCAP 91    Discharge time spent: greater than 30 minutes.  This record has been created using Conservation officer, historic buildings. Errors have been sought and corrected,but may not always be located. Such creation errors do not reflect on the standard of care.   Signed: Amaryllis Dare, MD [facility] 11/02/2023

## 2023-11-02 NOTE — Consult Note (Signed)
 PHARMACY - ANTICOAGULATION CONSULT NOTE  Pharmacy Consult for Heparin  Infusion Indication: PE  Allergies  Allergen Reactions   Albuterol Sulfate Other (See Comments)    Pt states it makes her have a bad feeling.    Other Other (See Comments)    Patient is allergic to eye drops causes eyes to swell   Patient Measurements: Height: 5' 7 (170.2 cm) Weight: 104.3 kg (230 lb) IBW/kg (Calculated) : 61.6 Heparin  Dosing Weight: 85.2 kg  Vital Signs: Temp: 98.3 F (36.8 C) (01/08 0500) Temp Source: Oral (01/08 0500) BP: 118/80 (01/08 0700) Pulse Rate: 73 (01/08 0700)  Labs: Recent Labs    10/31/23 1747 10/31/23 1747 10/31/23 2020 10/31/23 2030 11/01/23 0545 11/01/23 1300 11/01/23 2253 11/02/23 0608  HGB 13.7  --   --   --  13.0  --   --  10.9*  HCT 41.2  --   --   --  38.6  --   --  32.2*  PLT 182  --   --   --  165  --   --  144*  APTT  --   --   --  27  --   --   --   --   LABPROT  --   --   --  13.9  --   --   --   --   INR  --   --   --  1.1  --   --   --   --   HEPARINUNFRC  --    < >  --   --  0.28* 0.31 0.65 0.72*  CREATININE 0.94  --   --   --  0.86  --   --   --   TROPONINIHS 195*  --  186*  --   --   --   --   --    < > = values in this interval not displayed.   Estimated Creatinine Clearance: 102.6 mL/min (by C-G formula based on SCr of 0.86 mg/dL).  Medical History: Past Medical History:  Diagnosis Date   Asthma    Bipolar 1 disorder (HCC)    Schizophrenia (HCC)    Substance abuse (HCC)    Baseline Labs Hgb 13.7. PLT 182  Medications:  Not on anticoagulation at home  Assessment: Patient is a 48 year old female who present from group home with chest pain. Troponin 195. CTA of the chest which reveals pulmonary embolisms with right heart strain. Monitor for vascular recommendations and transition oral anticoagulation. Pharmacy has been consulted to initiate patient on a heparin  infusion.    Goal of Therapy:  Heparin  level 0.3-0.7 units/ml Monitor  platelets by anticoagulation protocol: Yes  Heparin  Levels Date/Time HL Clinical Assessment  1/7@0545  HL = 0.28 SUBtherapeutic  1/7 2253 0.65 Therapeutic x 1 after restart  1/8 0608 0.72 Slightly supratherapeutic           Plan:  Decrease heparin  infusion rate to 1150 units/hr Recheck heparin  level in 6 hr after rate change Monitor CBC daily  Rankin CANDIE Dills, PharmD, [facility] 11/02/2023 7:02 AM

## 2023-11-03 ENCOUNTER — Other Ambulatory Visit: Payer: Self-pay

## 2023-11-03 LAB — CARDIOLIPIN ANTIBODIES, IGG, IGM, IGA
Anticardiolipin IgA: <9 [APL'U]/mL (ref 0–11)
Anticardiolipin IgG: <9 [GPL'U]/mL (ref 0–14)
Anticardiolipin IgM: <9 [MPL'U]/mL (ref 0–12)

## 2023-11-03 LAB — BETA-2-GLYCOPROTEIN I ABS, IGG/M/A
Beta-2 Glyco I IgG: <9 GPI IgG units (ref 0–20)
Beta-2-Glycoprotein I IgA: <9 GPI IgA units (ref 0–25)
Beta-2-Glycoprotein I IgM: <9 GPI IgM units (ref 0–32)

## 2023-11-04 LAB — HOMOCYSTEINE: Homocysteine: 7.6 umol/L (ref 0.0–14.5)

## 2023-11-04 LAB — FACTOR 5 LEIDEN

## 2023-11-04 LAB — LUPUS ANTICOAGULANT PANEL
DRVVT: 29.8 s (ref 0.0–47.0)
PTT Lupus Anticoagulant: 38.6 s (ref 0.0–43.5)

## 2023-11-04 LAB — PROTEIN C ACTIVITY: Protein C Activity: 115 % (ref 73–180)

## 2023-11-04 LAB — PROTEIN C, TOTAL: Protein C, Total: 103 % (ref 60–150)

## 2023-11-04 LAB — PROTEIN S ACTIVITY: Protein S Activity: 43 % — ABNORMAL LOW (ref 63–140)

## 2023-11-04 LAB — PROTEIN S, TOTAL: Protein S Ag, Total: 54 % — ABNORMAL LOW (ref 60–150)

## 2023-11-07 LAB — PROTHROMBIN GENE MUTATION

## 2023-11-08 ENCOUNTER — Ambulatory Visit (INDEPENDENT_AMBULATORY_CARE_PROVIDER_SITE_OTHER): Payer: 59 | Admitting: Vascular Surgery

## 2023-11-09 ENCOUNTER — Telehealth (INDEPENDENT_AMBULATORY_CARE_PROVIDER_SITE_OTHER): Payer: Self-pay

## 2023-11-09 NOTE — Telephone Encounter (Signed)
 They can reschedule what was missed yesterday

## 2023-11-09 NOTE — Telephone Encounter (Signed)
 Stana Ear called to make Cassidy Thomas a post follow up appt, but the patient appt yesterday that was missed. Stana Ear was calling to see if they can reschedule her. Linda didn't know Brigetta had a follow up scheduled.   Please advise

## 2023-11-10 NOTE — Telephone Encounter (Signed)
Unfortunately I would usually like to see them sooner, but given that her original appointment was no showed, we can place them on a cancellation list

## 2023-11-10 NOTE — Telephone Encounter (Signed)
I'm not  sure being she just had surgery.   Cassidy Thomas please advise

## 2023-11-22 ENCOUNTER — Ambulatory Visit (INDEPENDENT_AMBULATORY_CARE_PROVIDER_SITE_OTHER): Payer: 59 | Admitting: Vascular Surgery

## 2023-11-25 ENCOUNTER — Ambulatory Visit (INDEPENDENT_AMBULATORY_CARE_PROVIDER_SITE_OTHER): Payer: 59 | Admitting: Vascular Surgery

## 2023-11-25 ENCOUNTER — Encounter (INDEPENDENT_AMBULATORY_CARE_PROVIDER_SITE_OTHER): Payer: Self-pay | Admitting: Vascular Surgery

## 2023-11-25 VITALS — BP 116/85 | HR 78 | Resp 16 | Wt 230.4 lb

## 2023-11-25 DIAGNOSIS — I2609 Other pulmonary embolism with acute cor pulmonale: Secondary | ICD-10-CM

## 2023-11-25 DIAGNOSIS — F17200 Nicotine dependence, unspecified, uncomplicated: Secondary | ICD-10-CM

## 2023-11-25 NOTE — Assessment & Plan Note (Signed)
The patient is doing very well status post pulmonary thrombectomy for submassive pulmonary embolus with right heart strain.  Continue anticoagulation for 1 year.  At that time, decision on reduction to prophylactic dose of Eliquis versus aspirin can be made.  We will see her back in 1 year.  Strongly recommended smoking cessation.  Compression socks for any leg swelling.  Continued activity and exercise are important.

## 2023-11-25 NOTE — Progress Notes (Signed)
MRN : [id number]  Cassidy Thomas is a 48 y.o. (31-Aug-1976) female who presents with chief complaint of  Chief Complaint  Patient presents with   Follow-up    [facility] pulmonary thrombectomy follow up  .  History of Present Illness: Patient returns today in follow up of her PE. About 3 or 4 weeks ago she underwent pulmonary thrombectomy for extensive pulmonary embolus with right heart strain.  She reports that she could not tell her breathing was better in the recovery room.  Within a few days, she says she was back to 100% in terms of her breathing.  She has been smoking at the facility and is urged to stop smoking immediately.  This would be deleterious to her pulmonary function as well as her vascular system going forward.  She had no periprocedural complications.  Her access site had no problems.  She is tolerating anticoagulation without any major issues currently.  Current Outpatient Medications  Medication Sig Dispense Refill   apixaban (ELIQUIS) 5 MG TABS tablet Take 2 tablets (10 mg total) by mouth 2 (two) times daily for 7 days, THEN 1 tablet (5 mg total) 2 (two) times daily. 208 tablet 0   APIXABAN (ELIQUIS) VTE STARTER PACK (10MG  AND 5MG ) Take as directed on package: start with two-5mg  tablets twice daily for 7 days. On day 8, switch to one-5mg  tablet twice daily. 74 each 0   budesonide-formoterol (SYMBICORT) 160-4.5 MCG/ACT inhaler Inhale 2 puffs into the lungs 2 (two) times daily.     famotidine (PEPCID) 40 MG tablet Take 40 mg by mouth at bedtime.     ferrous sulfate (FEROSUL) 325 (65 FE) MG tablet Take 1 tablet (325 mg total) by mouth 2 (two) times daily. 100 tablet 0   hydrOXYzine (VISTARIL) 25 MG capsule Take 25 mg by mouth 4 (four) times daily as needed for anxiety.     melatonin 3 MG TABS tablet Take 3 mg by mouth at bedtime.     nicotine (NICODERM CQ - DOSED IN MG/24 HOURS) 14 mg/24hr patch Place 1 patch (14 mg total) onto the skin daily. 28 patch 0   nystatin  (MYCOSTATIN/NYSTOP) powder Apply 1 Application topically 2 (two) times daily.     OLANZapine (ZYPREXA) 10 MG tablet Take 10 mg by mouth at bedtime.     simvastatin (ZOCOR) 20 MG tablet Take 20 mg by mouth at bedtime.     Vitamin D, Ergocalciferol, (DRISDOL) 1.25 MG (50000 UNIT) CAPS capsule Take 50,000 Units by mouth every 7 (seven) days.     No current facility-administered medications for this visit.    Past Medical History:  Diagnosis Date   Asthma    Bipolar 1 disorder (HCC)    Schizophrenia (HCC)    Substance abuse (HCC)     Past Surgical History:  Procedure Laterality Date   BREAST LUMPECTOMY  2012   right breast   PULMONARY THROMBECTOMY Bilateral 11/01/2023   Procedure: PULMONARY THROMBECTOMY;  Surgeon: Annice Needy, MD;  Location: [facility] INVASIVE CV LAB;  Service: Cardiovascular;  Laterality: Bilateral;     Social History   Tobacco Use   Smoking status: Some Days    Current packs/day: 0.25    Types: Cigarettes   Smokeless tobacco: Never  Substance Use Topics   Alcohol use: No   Drug use: Not Currently    Comment: psdt histoyr. last use of crack cocaine was last month ago.        Family History  Problem Relation  Age of Onset   Cancer Maternal Grandmother      Allergies  Allergen Reactions   Albuterol Sulfate Other (See Comments)    Pt states it makes her have a "bad" feeling.    Other Other (See Comments)    Patient is allergic to eye drops causes eyes to swell     REVIEW OF SYSTEMS (Negative unless checked)  Constitutional: [] Weight loss  [] Fever  [] Chills Cardiac: [] Chest pain   [] Chest pressure   [] Palpitations   [] Shortness of breath when laying flat   [] Shortness of breath at rest   [x] Shortness of breath with exertion. Vascular:  [] Pain in legs with walking   [] Pain in legs at rest   [] Pain in legs when laying flat   [] Claudication   [] Pain in feet when walking  [] Pain in feet at rest  [] Pain in feet when laying flat   [] History of DVT   [] Phlebitis    [] Swelling in legs   [] Varicose veins   [] Non-healing ulcers Pulmonary:   [] Uses home oxygen   [] Productive cough   [] Hemoptysis   [] Wheeze  [] COPD   [] Asthma Neurologic:  [] Dizziness  [] Blackouts   [] Seizures   [] History of stroke   [] History of TIA  [] Aphasia   [] Temporary blindness   [] Dysphagia   [] Weakness or numbness in arms   [] Weakness or numbness in legs Musculoskeletal:  [] Arthritis   [] Joint swelling   [x] Joint pain   [] Low back pain Hematologic:  [] Easy bruising  [] Easy bleeding   [] Hypercoagulable state   [] Anemic   Gastrointestinal:  [] Blood in stool   [] Vomiting blood  [] Gastroesophageal reflux/heartburn   [] Abdominal pain Genitourinary:  [] Chronic kidney disease   [] Difficult urination  [] Frequent urination  [] Burning with urination   [] Hematuria Skin:  [] Rashes   [] Ulcers   [] Wounds Psychological:  [] History of anxiety   [x]  History of major depression.  Physical Examination  BP 116/85   Pulse 78   Resp 16   Wt 230 lb 6.4 oz (104.5 kg)   BMI 36.09 kg/m  Gen:  WD/WN, NAD Head: [place]/AT, No temporalis wasting. Ear/Nose/Throat: Hearing grossly intact, nares w/o erythema or drainage Eyes: Conjunctiva clear. Sclera non-icteric Neck: Supple.  Trachea midline Pulmonary:  Good air movement, no use of accessory muscles.  Cardiac: RRR, no JVD Vascular:  Vessel Right Left  Radial Palpable Palpable                   Musculoskeletal: M/S 5/5 throughout.  No deformity or atrophy. No significant LE edema. Neurologic: Sensation grossly intact in extremities.  Symmetrical.  Speech is fluent.  Psychiatric: Judgment intact, Mood & affect appropriate for pt's clinical situation. Dermatologic: No rashes or ulcers noted.  No cellulitis or open wounds.      Labs Recent Results (from the past 2160 hours)  Basic metabolic panel     Status: Abnormal   Collection Time: 10/31/23  5:47 PM  Result Value Ref Range   Sodium 137 135 - 145 mmol/L   Potassium 3.9 3.5 - 5.1 mmol/L    Chloride 106 98 - 111 mmol/L   CO2 22 22 - 32 mmol/L   Glucose, Bld 117 (H) 70 - 99 mg/dL    Comment: Glucose reference range applies only to samples taken after fasting for at least 8 hours.   BUN 10 6 - 20 mg/dL   Creatinine, Ser 1.02 0.44 - 1.00 mg/dL   Calcium 9.1 8.9 - 72.5 mg/dL   GFR, Estimated >36 >64  mL/min    Comment: (NOTE) Calculated using the CKD-EPI Creatinine Equation (2021)    Anion gap 9 5 - 15    Comment: Performed at [facility], [address] [place], [place] [address]  CBC     Status: None   Collection Time: 10/31/23  5:47 PM  Result Value Ref Range   WBC 9.2 4.0 - 10.5 K/uL   RBC 4.62 3.87 - 5.11 MIL/uL   Hemoglobin 13.7 12.0 - 15.0 g/dL   HCT 09.8 11.9 - 14.7 %   MCV 89.2 80.0 - 100.0 fL   MCH 29.7 26.0 - 34.0 pg   MCHC 33.3 30.0 - 36.0 g/dL   RDW 82.9 56.2 - 13.0 %   Platelets 182 150 - 400 K/uL   nRBC 0.0 0.0 - 0.2 %    Comment: Performed at [facility], [address] [place], [place] [address]  Troponin I (High Sensitivity)     Status: Abnormal   Collection Time: 10/31/23  5:47 PM  Result Value Ref Range   Troponin I (High Sensitivity) 195 (HH) <18 ng/L    Comment: CRITICAL RESULT CALLED TO, READ BACK BY AND VERIFIED WITH AMY COYNE @1835  ON 10/31/23 SKL (NOTE) Elevated high sensitivity troponin I (hsTnI) values and significant  changes across serial measurements may suggest ACS but many other  chronic and acute conditions are known to elevate hsTnI results.  Refer to the "Links" section for chest pain algorithms and additional  guidance. Performed at [facility], [address] [place], [place] [address]   Brain natriuretic peptide     Status: Abnormal   Collection Time: 10/31/23  5:47 PM  Result Value Ref Range   B Natriuretic Peptide 209.7 (H) 0.0 - 100.0 pg/mL    Comment: Performed at [facility], [address] [place], [place] [address]  Troponin I (High Sensitivity)     Status: Abnormal    Collection Time: 10/31/23  8:20 PM  Result Value Ref Range   Troponin I (High Sensitivity) 186 (HH) <18 ng/L    Comment: CRITICAL VALUE NOTED. VALUE IS CONSISTENT WITH PREVIOUSLY REPORTED/CALLED VALUE SKL (NOTE) Elevated high sensitivity troponin I (hsTnI) values and significant  changes across serial measurements may suggest ACS but many other  chronic and acute conditions are known to elevate hsTnI results.  Refer to the "Links" section for chest pain algorithms and additional  guidance. Performed at [facility], [address] [place], [place] [address]   Hepatic function panel     Status: Abnormal   Collection Time: 10/31/23  8:20 PM  Result Value Ref Range   Total Protein 7.1 6.5 - 8.1 g/dL   Albumin 3.1 (L) 3.5 - 5.0 g/dL   AST 18 15 - 41 U/L   ALT 13 0 - 44 U/L   Alkaline Phosphatase 97 38 - 126 U/L   Total Bilirubin 0.3 0.0 - 1.2 mg/dL   Bilirubin, Direct <0.1 0.0 - 0.2 mg/dL   Indirect Bilirubin NOT CALCULATED 0.3 - 0.9 mg/dL    Comment: Performed at [facility], [address] [place], [place] [address]  D-dimer, quantitative     Status: Abnormal   Collection Time: 10/31/23  8:30 PM  Result Value Ref Range   D-Dimer, Quant 2.79 (H) 0.00 - 0.50 ug/mL-FEU    Comment: (NOTE) At the manufacturer cut-off value of 0.5 g/mL FEU, this assay has a negative predictive value of 95-100%.This assay is intended for use in conjunction with  a clinical pretest probability (PTP) assessment model to exclude pulmonary embolism (PE) and deep venous thrombosis (DVT) in outpatients suspected of PE or DVT. Results should be correlated with clinical presentation. Performed at [facility], [address] [place], [place] [address]   Protime-INR     Status: None   Collection Time: 10/31/23  8:30 PM  Result Value Ref Range   Prothrombin Time 13.9 11.4 - 15.2 seconds   INR 1.1 0.8 - 1.2    Comment: (NOTE) INR goal varies based on device and disease  states. Performed at [facility], [address] [place], [place] [address]   APTT     Status: None   Collection Time: 10/31/23  8:30 PM  Result Value Ref Range   aPTT 27 24 - 36 seconds    Comment: Performed at [facility], [address] [place], [place] [address]  Heparin level (unfractionated)     Status: Abnormal   Collection Time: 11/01/23  5:45 AM  Result Value Ref Range   Heparin Unfractionated 0.28 (L) 0.30 - 0.70 IU/mL    Comment: (NOTE) The clinical reportable range upper limit is being lowered to >1.10 to align with the FDA approved guidance for the current laboratory assay.  If heparin results are below expected values, and patient dosage has  been confirmed, suggest follow up testing of antithrombin III levels. Performed at [facility], [address] [place], [place] [address]   CBC     Status: None   Collection Time: 11/01/23  5:45 AM  Result Value Ref Range   WBC 8.2 4.0 - 10.5 K/uL   RBC 4.37 3.87 - 5.11 MIL/uL   Hemoglobin 13.0 12.0 - 15.0 g/dL   HCT 29.5 62.1 - 30.8 %   MCV 88.3 80.0 - 100.0 fL   MCH 29.7 26.0 - 34.0 pg   MCHC 33.7 30.0 - 36.0 g/dL   RDW 65.7 84.6 - 96.2 %   Platelets 165 150 - 400 K/uL   nRBC 0.0 0.0 - 0.2 %    Comment: Performed at [facility], [address], [place], [place] [address]  Comprehensive metabolic panel     Status: Abnormal   Collection Time: 11/01/23  5:45 AM  Result Value Ref Range   Sodium 135 135 - 145 mmol/L   Potassium 3.9 3.5 - 5.1 mmol/L   Chloride 105 98 - 111 mmol/L   CO2 20 (L) 22 - 32 mmol/L   Glucose, Bld 93 70 - 99 mg/dL    Comment: Glucose reference range applies only to samples taken after fasting for at least 8 hours.   BUN 9 6 - 20 mg/dL   Creatinine, Ser 1.32 0.44 - 1.00 mg/dL   Calcium 8.7 (L) 8.9 - 10.3 mg/dL   Total Protein 6.8 6.5 - 8.1 g/dL   Albumin 3.0 (L) 3.5 - 5.0 g/dL   AST 17 15 - 41 U/L   ALT 12 0 - 44 U/L   Alkaline Phosphatase 88 38  - 126 U/L   Total Bilirubin 0.5 0.0 - 1.2 mg/dL   GFR, Estimated >44 >01 mL/min    Comment: (NOTE) Calculated using the CKD-EPI Creatinine Equation (2021)    Anion gap 10 5 - 15    Comment: Performed at [facility], [address] [place], [place] [address]  Hemoglobin A1c     Status: Abnormal   Collection Time: 11/01/23  5:45 AM  Result Value Ref Range   Hgb  A1c MFr Bld 5.7 (H) 4.8 - 5.6 %    Comment: (NOTE)         Prediabetes: 5.7 - 6.4         Diabetes: >6.4         Glycemic control for adults with diabetes: <7.0    Mean Plasma Glucose 117 mg/dL    Comment: (NOTE) Performed At: [facility] [address] [place], [place] [id number] Jolene Schimke MD XB:[id number]   RPR     Status: None   Collection Time: 11/01/23  5:45 AM  Result Value Ref Range   RPR Ser Ql NON REACTIVE NON REACTIVE    Comment: Performed at [facility], [address] [place], [place] [address]  HIV Antibody (routine testing w rflx)     Status: None   Collection Time: 11/01/23  5:45 AM  Result Value Ref Range   HIV Screen 4th Generation wRfx Non Reactive Non Reactive    Comment: Performed at [facility], [address], [place], [place] [address]  Urine Drug Screen, Qualitative ([facility] only)     Status: None   Collection Time: 11/01/23  6:01 AM  Result Value Ref Range   Tricyclic, Ur Screen NONE DETECTED NONE DETECTED   Amphetamines, Ur Screen NONE DETECTED NONE DETECTED   MDMA (Ecstasy)Ur Screen NONE DETECTED NONE DETECTED   Cocaine Metabolite,Ur [place] NONE DETECTED NONE DETECTED   Opiate, Ur Screen NONE DETECTED NONE DETECTED   Phencyclidine (PCP) Ur S NONE DETECTED NONE DETECTED   Cannabinoid 50 Ng, Ur [place] NONE DETECTED NONE DETECTED   Barbiturates, Ur Screen NONE DETECTED NONE DETECTED   Benzodiazepine, Ur Scrn NONE DETECTED NONE DETECTED   Methadone Scn, Ur NONE DETECTED NONE DETECTED    Comment: (NOTE) Tricyclics + metabolites, urine    Cutoff 1000  ng/mL Amphetamines + metabolites, urine  Cutoff 1000 ng/mL MDMA (Ecstasy), urine              Cutoff 500 ng/mL Cocaine Metabolite, urine          Cutoff 300 ng/mL Opiate + metabolites, urine        Cutoff 300 ng/mL Phencyclidine (PCP), urine         Cutoff 25 ng/mL Cannabinoid, urine                 Cutoff 50 ng/mL Barbiturates + metabolites, urine  Cutoff 200 ng/mL Benzodiazepine, urine              Cutoff 200 ng/mL Methadone, urine                   Cutoff 300 ng/mL  The urine drug screen provides only a preliminary, unconfirmed analytical test result and should not be used for non-medical purposes. Clinical consideration and professional judgment should be applied to any positive drug screen result due to possible interfering substances. A more specific alternate chemical method must be used in order to obtain a confirmed analytical result. Gas chromatography / mass spectrometry (GC/MS) is the preferred confirm atory method. Performed at [facility], [address] [place], [place] [address]   Heparin level (unfractionated)     Status: None   Collection Time: 11/01/23  1:00 PM  Result Value Ref Range   Heparin Unfractionated 0.31 0.30 - 0.70 IU/mL    Comment: (NOTE) The clinical reportable range upper limit is being lowered to >1.10 to align with the FDA approved guidance for the current laboratory assay.  If  heparin results are below expected values, and patient dosage has  been confirmed, suggest follow up testing of antithrombin III levels. Performed at [facility], [address] [place], [place] [address]   Glucose, capillary     Status: None   Collection Time: 11/01/23  5:25 PM  Result Value Ref Range   Glucose-Capillary 91 70 - 99 mg/dL    Comment: Glucose reference range applies only to samples taken after fasting for at least 8 hours.  MRSA Next Gen by PCR, Nasal     Status: None   Collection Time: 11/01/23  5:29 PM   Specimen: Nasal  Mucosa; Nasal Swab  Result Value Ref Range   MRSA by PCR Next Gen NOT DETECTED NOT DETECTED    Comment: (NOTE) The GeneXpert MRSA Assay (FDA approved for NASAL specimens only), is one component of a comprehensive MRSA colonization surveillance program. It is not intended to diagnose MRSA infection nor to guide or monitor treatment for MRSA infections. Test performance is not FDA approved in patients less than 59 years old. Performed at [facility], [address] [place], [place] [address]   Antithrombin III     Status: None   Collection Time: 11/01/23  6:01 PM  Result Value Ref Range   AntiThromb III Func 77 75 - 120 %    Comment: Performed at [facility], [address] [place], [place] [address]  Protein C activity     Status: None   Collection Time: 11/01/23  6:01 PM  Result Value Ref Range   Protein C Activity 115 73 - 180 %    Comment: (NOTE) Performed At: [facility] [address] [place], [place] [id number] Jolene Schimke MD OZ:[id number]   Protein C, total     Status: None   Collection Time: 11/01/23  6:01 PM  Result Value Ref Range   Protein C, Total 103 60 - 150 %    Comment: (NOTE) Performed At: [facility] [address] [place], [place] [id number] Jolene Schimke MD KG:[id number]   Protein S activity     Status: Abnormal   Collection Time: 11/01/23  6:01 PM  Result Value Ref Range   Protein S Activity 43 (L) 63 - 140 %    Comment: (NOTE) A deficiency of protein S (PS), either congenital or acquired, increases the risk of thromboembolism. PS activity levels may be falsely low in individuals with APCR/Factor V Leiden. Consider performing free protein S antigen in those with APCR/Factor V Leiden before making a diagnosis of protein S deficiency. Acquired PS deficiency is more common than congenital deficiency. PS values decrease with normal pregnancy, and are also dependent on age, sex and hormone status. PS values tend  to be lower in a younger age group and lower in women than in men. Levels may be decreased in pre-menopausal women on oral contraceptive agents. Acquired deficiency can occur as a result of vitamin K deficiency or antagonism, severe hepatic disorders, (hepatitis, cirrhosis, etc.), nephrotic syndrome, inflammatory bowel disease, certain chemotherapeutic agents, L-asparaginse therapy, sepsis, disseminated intravascular coagulation (DIC) and acute thrombosis. Levels may be decreased in  patients with polycythemia vera, sickle cell disease and essential thrombocythemia. Repeat evaluation on a new plasma sample to confirm or refute this result should be considered, after ruling out acquired causes, depending on the clinical scenario. Performed At: [facility] [address] [place], [place] [id number] Jolene Schimke MD VZ:[id number]   Protein S, total     Status: Abnormal  Collection Time: 11/01/23  6:01 PM  Result Value Ref Range   Protein S Ag, Total 54 (L) 60 - 150 %    Comment: (NOTE) This test was developed and its performance characteristics determined by [facility]. It has not been cleared or approved by the Food and Drug Administration. Performed At: [facility] [address] [place], [place] [id number] Jolene Schimke MD QM:[id number]   Lupus anticoagulant panel     Status: None   Collection Time: 11/01/23  6:01 PM  Result Value Ref Range   PTT Lupus Anticoagulant 38.6 0.0 - 43.5 sec    Comment: (NOTE) Additional testing confirms the presence of heparin in the test sample. Results obtained after heparin neutralization.    DRVVT 29.8 0.0 - 47.0 sec   Lupus Anticoag Interp Comment:     Comment: (NOTE) No lupus anticoagulant was detected. Performed At: [facility] [address] [place], [place] [id number] Jolene Schimke MD NU:[id number]   Beta-2-glycoprotein i abs, IgG/M/A     Status: None   Collection Time: 11/01/23  6:01 PM  Result Value Ref  Range   Beta-2 Glyco I IgG <9 0 - 20 GPI IgG units    Comment: (NOTE) The reference interval reflects a 3SD or 99th percentile interval, which is thought to represent a potentially clinically significant result in accordance with the International Consensus Statement on the classification criteria for definitive antiphospholipid syndrome (APS). J Thromb Haem 2006;4:295-306.    Beta-2-Glycoprotein I IgM <9 0 - 32 GPI IgM units    Comment: (NOTE) The reference interval reflects a 3SD or 99th percentile interval, which is thought to represent a potentially clinically significant result in accordance with the International Consensus Statement on the classification criteria for definitive antiphospholipid syndrome (APS). J Thromb Haem 2006;4:295-306. Performed At: [facility] [address] [place], [place] [id number] Jolene Schimke MD [id number]    Beta-2-Glycoprotein I IgA <9 0 - 25 GPI IgA units    Comment: (NOTE) The reference interval reflects a 3SD or 99th percentile interval, which is thought to represent a potentially clinically significant result in accordance with the International Consensus Statement on the classification criteria for definitive antiphospholipid syndrome (APS). J Thromb Haem 2006;4:295-306.   Homocysteine, serum     Status: None   Collection Time: 11/01/23  6:01 PM  Result Value Ref Range   Homocysteine 7.6 0.0 - 14.5 umol/L    Comment: (NOTE) Performed At: [facility] [address] [place], [place] [id number] Jolene Schimke MD KZ:[id number]   Factor 5 leiden     Status: None   Collection Time: 11/01/23  6:01 PM  Result Value Ref Range   Recommendations-F5LEID: Comment     Comment: (NOTE) Result: c.1601G>A (p.Arg534Gln) - Not Detected This result is not associated with an increased risk for venous thromboembolism. See Additional Clinical Information and Comments. Additional Clinical Information: Venous thromboembolism is a  multifactorial disease influenced by genetic, environmental, and circumstantial risk factors. The c.1601G>A (p. Arg534Gln) variant in the F5 gene, commonly referred to as Factor V Leiden, is a genetic risk factor for venous thromboembolism. Heterozygous carriers of this variant have a 6- to 8- fold increased risk for venous thromboembolism. Individuals homozygous for this variant (ie, with a copy of the variant on each chromosome) have an approximately 80-fold increased risk for venous thromboembolism. Individuals who carry both a c.*97G>A variant in the F2 gene and Factor V Leiden have an approximately 20-fold increased risk for venous thromboembolism. Risks are likely to be even higher  in more complex genotype combinations in volving the F2 c.*97G>A variant and Factor V Leiden (PMID: 16109604). Additional risk factors include but are not limited to: deficiency of protein C, protein S, or antithrombin III, age, female sex, personal or family history of deep vein thromboembolism, smoking, surgery, prolonged immobilization, malignant neoplasm, tamoxifen treatment, raloxifene treatment, oral contraceptive use, hormone replacement therapy, and pregnancy. Management of thrombotic risk and thrombotic events should follow established guidelines and fit the clinical circumstance. This result cannot predict the occurrence or recurrence of a thrombotic event. Comment: Genetic counseling is recommended to discuss the potential clinical implications of positive results, as well as recommendations for testing family members. Genetic Coordinators are available for health care providers to discuss results at [phone]). Test Details: Variant Analyzed: c.1601G>A (p. Arg534Gln), referred to as Fact or V Leiden Methods/Limitations: DNA analysis of the F5 gene (NM_000130.5) was performed by PCR amplification followed by restriction enzyme analysis. The diagnostic sensitivity is >99%. Results  must be combined with clinical information for the most accurate interpretation. Molecular- based testing is highly accurate, but as in any laboratory test, diagnostic errors may occur. False positive or false negative results may occur for reasons that include genetic variants, blood transfusions, bone marrow transplantation, somatic or tissue-specific mosaicism, mislabeled samples, or erroneous representation of family relationships. This test was developed and its performance characteristics determined by [facility]. It has not been cleared or approved by the Food and Drug Administration. References: Dewitt Hoes [facility], Valentina Lucks [facility]; ACMG Professional Practice and Guidelines Committee. Addendum: [employer] consensus statement on fac tor V Leiden mutation testing. Genet Med. 2021 Mar 5. doi: 81.1914/N82956-213- 01108-x. PMID: 08657846. Cherrie Gauze. Factor V Leiden Thrombophilia. 1999 May 14 (Updated 2018 Jan 4). In: Bufford Buttner, Ardinger HH, Pagon RA, et al., editors. GeneReviews(R) (Internet). [address] ([place]): [place], [place]; 9629-5284. Available from: [email or web address] Nita Sickle, Blanca Friend, Alvie Heidelberg CS; ACMG Laboratory Quality Assurance Committee. Venous thromboembolism laboratory testing (factor V Leiden and factor II c.*97G>A), 2018 update: a technical standard of the [employer] of [employer] and [employer] ([facility]MG). Genet Med. 2018 Dec;20(12):1489-1498. doi: 10.1038/[id number]-z. Epub 2018 Oct 5. PMID: 13244010.    Reviewed By: Comment     Comment: (NOTE) Technical Component performed at [employer] [facility] Professional Component performed by: [employer] [facility] [employer] Lowella Dell, Ph.D., [facility] Director, Molecular Genetics [address] [place] [address] Performed At: [facility] [address] [place], [place] [id number] Maurine Simmering MDPhD [id number]   Prothrombin gene mutation     Status: None   Collection Time: 11/01/23  6:01 PM  Result Value Ref Range   Recommendations-PTGENE: Comment     Comment: (NOTE) Result: c.*97G>A - Not Detected This result is not associated with an increased risk for venous thromboembolism. See Additional Clinical Information and Comments. Additional Clinical Information: Venous thromboembolism is a multifactorial disease influenced by genetic, environmental, and circumstantial risk factors. The c.*97G>A variant in the F2 gene is a genetic risk factor for venous thromboembolism. Heterozygous carriers have a 2- to 4-fold increased risk for venous thromboembolism. Homozygotes for the c.*97G>A variant are rare. The annual risk of VTE in homozygotes has been reported to be 1.1%/year. Individuals who carry both a c.*97G>A variant in the F2 gene and a c.1601G>A (p. Arg534Gln) variant in the F5 gene (commonly referred to as Factor V Leiden) have an approximately 20- fold increased risk for venous thromboembolism.  Risks are likely to be even higher in more complex genotype combinations involving the F2 c.*97G>A variant and Factor V Leiden (PMID:  95621308). Additional risk factors include but are not limited to: deficiency of protein C, protein S, or antithrombin III, age, female sex, personal or family history of deep vein thromboembolism, smoking, surgery, prolonged immobilization, malignant neoplasm, tamoxifen treatment, raloxifene treatment, oral contraceptive use, hormone replacement therapy, and pregnancy. Management of thrombotic risk and thrombotic events should follow established guidelines and fit the clinical circumstance. This result cannot predict the occurrence or recurrence of a thrombotic event. Comments: Genetic counseling is recommended to discuss the potential clinical implications of positive results, as well as recommendations for testing family members. Genetic  Coordinators are available for health care providers to discuss results at [phone]). Test Details: Variant analyzed: c.*97G>A, previously referred to as G20210A Methods/Limitations: DNA analysis of the F2 gene (NM_000 506.5) was performed by PCR amplification followed by restriction enzyme analysis. The diagnostic sensitivity is >99%. Results must be combined with clinical information for the most accurate interpretation. Molecular-based testing is highly accurate, but as in any laboratory test, diagnostic errors may occur. False positive or false negative results may occur for reasons that include genetic variants, blood transfusions, bone marrow transplantation, somatic or tissue-specific mosaicism, mislabeled samples, or erroneous representation of family relationships. This test was developed and its performance characteristics determined by [facility]. It has not been cleared or approved by the Food and Drug Administration. References: Dewitt Hoes [facility], Valentina Lucks [facility]; ACMG Professional Practice and Guidelines Committee. Addendum: [employer] consensus statement on factor V Leiden mutation testing. Genet Med. 2021 Mar 5. doi: 46.9629/B284 [phone]. PMID: 13244010. Cherrie Gauze. Prothrombin Thrombophilia. 2006 Jul 25 [Updated 2021 Feb 4]. In: Bufford Buttner, Ardinger HH, Pagon RA, et al., editors. GeneReviews(R) [Internet]. [address] ([place]): [place], [place]; 2725[address]3664. Available from: [email or web address] Nita Sickle, Blanca Friend, Alvie Heidelberg CS; ACMG Laboratory Quality Assurance Committee. Venous thromboembolism laboratory testing (factor V Leiden and factor II c.*97G>A), 2018 update: a technical standard of the [employer] (ACMG). Genet Med. 2018 Dec;20(12):1489-1498. doi: 10.1038/[id number]. Epub 2018 Oct 5. PMID:  40347425.    Reviewed by: Comment     Comment: (NOTE) Technical Component performed at [employer] [facility] Professional Component performed by: [facility] [employer] Alpha Gula, Ph.D., [facility] Director, Molecular Genetics [address] [place] [address] Performed At: [facility] [address] [place], [place] [id number] Maurine Simmering MDPhD [id number]   Cardiolipin antibodies, IgG, IgM, IgA     Status: None   Collection Time: 11/01/23  6:01 PM  Result Value Ref Range   Anticardiolipin IgG <9 0 - 14 GPL U/mL    Comment: (NOTE)                          Negative:              <15                          Indeterminate:     15 - 20                          Low-Med Positive: >20 - 80  High Positive:         >80    Anticardiolipin IgM <9 0 - 12 MPL U/mL    Comment: (NOTE)                          Negative:              <13                          Indeterminate:     13 - 20                          Low-Med Positive: >20 - 80                          High Positive:         >80    Anticardiolipin IgA <9 0 - 11 APL U/mL    Comment: (NOTE)                          Negative:              <12                          Indeterminate:     12 - 20                          Low-Med Positive: >20 - 80                          High Positive:         >80 Performed At: [facility] [address] [place], [place] [id number] Jolene Schimke MD IO:[id number]   ECHOCARDIOGRAM COMPLETE     Status: None   Collection Time: 11/01/23 10:00 PM  Result Value Ref Range   Weight 3,680 oz   Height 67 in   BP 126/87 mmHg   S' Lateral 2.60 cm   Area-P 1/2 3.84 cm2   Est EF 55 - 60%   Heparin level (unfractionated)     Status: None   Collection Time: 11/01/23 10:53 PM  Result Value Ref Range   Heparin Unfractionated 0.65 0.30 - 0.70 IU/mL    Comment: (NOTE) The clinical reportable range upper limit is being lowered to >1.10 to align with the FDA  approved guidance for the current laboratory assay.  If heparin results are below expected values, and patient dosage has  been confirmed, suggest follow up testing of antithrombin III levels. Performed at [facility], [address] [place], [place] [address]   CBC     Status: Abnormal   Collection Time: 11/02/23  6:08 AM  Result Value Ref Range   WBC 5.4 4.0 - 10.5 K/uL   RBC 3.74 (L) 3.87 - 5.11 MIL/uL   Hemoglobin 10.9 (L) 12.0 - 15.0 g/dL   HCT 02.7 (L) 25.3 - 66.4 %   MCV 86.1 80.0 - 100.0 fL   MCH 29.1 26.0 - 34.0 pg   MCHC 33.9 30.0 - 36.0 g/dL   RDW 40.3 47.4 - 25.9 %   Platelets 144 (L) 150 - 400 K/uL   nRBC 0.0 0.0 - 0.2 %    Comment: Performed at  [facility], [address], [place], [place] [address]  Heparin level (unfractionated)     Status: Abnormal   Collection Time: 11/02/23  6:08 AM  Result Value Ref Range   Heparin Unfractionated 0.72 (H) 0.30 - 0.70 IU/mL    Comment: (NOTE) The clinical reportable range upper limit is being lowered to >1.10 to align with the FDA approved guidance for the current laboratory assay.  If heparin results are below expected values, and patient dosage has  been confirmed, suggest follow up testing of antithrombin III levels. Performed at [facility], [address], [place], [place] [address]     Radiology ECHOCARDIOGRAM COMPLETE Result Date: 11/02/2023    ECHOCARDIOGRAM REPORT   Patient Name:   JAZZ BIDDY Date of Exam: 11/01/2023 Medical Rec #:  [id number]       Height:       67.0 in Accession #:    [id number]      Weight:       230.0 lb Date of Birth:  [birth year]        BSA:          2.146 m Patient Age:    47 years        BP:           101/74 mmHg Patient Gender: F               HR:           70 bpm. Exam Location:  [facility] Procedure: 2D Echo, Cardiac Doppler and Color Doppler Indications:     R07.9 Chest pain  History:         Patient has no prior history of Echocardiogram examinations.   Sonographer:     Daphine Deutscher RDCS Referring Phys:  [id number] Marlow Baars Cyndie Woodbeck Diagnosing Phys: Marcina Millard MD IMPRESSIONS  1. Left ventricular ejection fraction, by estimation, is 55 to 60%. The left ventricle has normal function. The left ventricle has no regional wall motion abnormalities. Left ventricular diastolic parameters are consistent with Grade I diastolic dysfunction (impaired relaxation).  2. Right ventricular systolic function is normal. The right ventricular size is normal.  3. The mitral valve is normal in structure. Trivial mitral valve regurgitation. No evidence of mitral stenosis.  4. The aortic valve is normal in structure. Aortic valve regurgitation is not visualized. No aortic stenosis is present.  5. The inferior vena cava is normal in size with greater than 50% respiratory variability, suggesting right atrial pressure of 3 mmHg. FINDINGS  Left Ventricle: Left ventricular ejection fraction, by estimation, is 55 to 60%. The left ventricle has normal function. The left ventricle has no regional wall motion abnormalities. The left ventricular internal cavity size was normal in size. There is  no left ventricular hypertrophy. Left ventricular diastolic parameters are consistent with Grade I diastolic dysfunction (impaired relaxation). Right Ventricle: The right ventricular size is normal. No increase in right ventricular wall thickness. Right ventricular systolic function is normal. Left Atrium: Left atrial size was normal in size. Right Atrium: Right atrial size was normal in size. Pericardium: There is no evidence of pericardial effusion. Mitral Valve: The mitral valve is normal in structure. Trivial mitral valve regurgitation. No evidence of mitral valve stenosis. Tricuspid Valve: The tricuspid valve is normal in structure. Tricuspid valve regurgitation is trivial. No evidence of tricuspid stenosis. Aortic Valve: The aortic valve is normal in structure. Aortic valve regurgitation is  not visualized. No aortic stenosis is present. Pulmonic Valve: The pulmonic  valve was normal in structure. Pulmonic valve regurgitation is not visualized. No evidence of pulmonic stenosis. Aorta: The aortic root is normal in size and structure. Venous: The inferior vena cava is normal in size with greater than 50% respiratory variability, suggesting right atrial pressure of 3 mmHg. IAS/Shunts: No atrial level shunt detected by color flow Doppler.  LEFT VENTRICLE PLAX 2D LVIDd:         3.70 cm   Diastology LVIDs:         2.60 cm   LV e' medial:    10.26 cm/s LV PW:         1.10 cm   LV E/e' medial:  6.3 LV IVS:        1.10 cm   LV e' lateral:   14.53 cm/s LVOT diam:     1.70 cm   LV E/e' lateral: 4.4 LV SV:         43 LV SV Index:   20 LVOT Area:     2.27 cm  RIGHT VENTRICLE             IVC RV Basal diam:  4.10 cm     IVC diam: 1.70 cm RV S prime:     13.90 cm/s TAPSE (M-mode): 2.2 cm LEFT ATRIUM             Index        RIGHT ATRIUM           Index LA diam:        3.70 cm 1.72 cm/m   RA Area:     11.40 cm LA Vol (A2C):   31.7 ml 14.77 ml/m  RA Volume:   28.90 ml  13.47 ml/m LA Vol (A4C):   39.1 ml 18.22 ml/m LA Biplane Vol: 38.4 ml 17.89 ml/m  AORTIC VALVE LVOT Vmax:   109.50 cm/s LVOT Vmean:  68.400 cm/s LVOT VTI:    0.188 m  AORTA Ao Root diam: 3.20 cm Ao Asc diam:  3.00 cm MITRAL VALVE               TRICUSPID VALVE MV Area (PHT): 3.84 cm    TR Peak grad:   19.4 mmHg MV Decel Time: 198 msec    TR Vmax:        220.00 cm/s MV E velocity: 64.20 cm/s MV A velocity: 89.90 cm/s  SHUNTS MV E/A ratio:  0.71        Systemic VTI:  0.19 m                            Systemic Diam: 1.70 cm Marcina Millard MD Electronically signed by Marcina Millard MD Signature Date/Time: 11/02/2023/8:09:21 AM    Final    PERIPHERAL VASCULAR CATHETERIZATION Result Date: 11/01/2023 See surgical note for result.  US Venous Img Lower Bilateral (DVT) Result Date: 11/01/2023 CLINICAL DATA:  Acute pulmonary embolism. EXAM:  BILATERAL LOWER EXTREMITY VENOUS DOPPLER ULTRASOUND TECHNIQUE: Gray-scale sonography with graded compression, as well as color Doppler and duplex ultrasound were performed to evaluate the lower extremity deep venous systems from the level of the common femoral vein and including the common femoral, femoral, profunda femoral, popliteal and calf veins including the posterior tibial, peroneal and gastrocnemius veins when visible. The superficial great saphenous vein was also interrogated. Spectral Doppler was utilized to evaluate flow at rest and with distal augmentation maneuvers in the common femoral, femoral and popliteal veins. COMPARISON:  None  Available. FINDINGS: RIGHT LOWER EXTREMITY Common Femoral Vein: No evidence of thrombus. Normal compressibility, respiratory phasicity and response to augmentation. Saphenofemoral Junction: No evidence of thrombus. Normal compressibility and flow on color Doppler imaging. Profunda Femoral Vein: No evidence of thrombus. Normal compressibility and flow on color Doppler imaging. Femoral Vein: No evidence of thrombus. Normal compressibility, respiratory phasicity and response to augmentation. Popliteal Vein: No evidence of thrombus. Normal compressibility, respiratory phasicity and response to augmentation. Calf Veins: No evidence of thrombus. Normal compressibility and flow on color Doppler imaging. Superficial Great Saphenous Vein: No evidence of thrombus. Normal compressibility. Venous Reflux:  None. Other Findings: No evidence of superficial thrombophlebitis or abnormal fluid collection. LEFT LOWER EXTREMITY Common Femoral Vein: No evidence of thrombus. Normal compressibility, respiratory phasicity and response to augmentation. Saphenofemoral Junction: No evidence of thrombus. Normal compressibility and flow on color Doppler imaging. Profunda Femoral Vein: No evidence of thrombus. Normal compressibility and flow on color Doppler imaging. Femoral Vein: No evidence of  thrombus. Normal compressibility, respiratory phasicity and response to augmentation. Popliteal Vein: No evidence of thrombus. Normal compressibility, respiratory phasicity and response to augmentation. Calf Veins: No evidence of thrombus. Normal compressibility and flow on color Doppler imaging. Superficial Great Saphenous Vein: No evidence of thrombus. Normal compressibility. Venous Reflux:  None. Other Findings: No evidence of superficial thrombophlebitis or abnormal fluid collection. IMPRESSION: No evidence of deep venous thrombosis in either lower extremity. Electronically Signed   By: Irish Lack M.D.   On: 11/01/2023 13:38   CT Angio Chest PE W and/or Wo Contrast Result Date: 10/31/2023 CLINICAL DATA:  Midline chest pain, elevated troponin, D-dimer elevated. Evaluating for PE EXAM: CT ANGIOGRAPHY CHEST WITH CONTRAST TECHNIQUE: Multidetector CT imaging of the chest was performed using the standard protocol during bolus administration of intravenous contrast. Multiplanar CT image reconstructions and MIPs were obtained to evaluate the vascular anatomy. RADIATION DOSE REDUCTION: This exam was performed according to the departmental dose-optimization program which includes automated exposure control, adjustment of the mA and/or kV according to patient size and/or use of iterative reconstruction technique. CONTRAST:  OMNIPAQUE IOHEXOL 350 MG/ML SOLN COMPARISON:  None Available. FINDINGS: Cardiovascular: Filling defects within the pulmonary arteries bilaterally involving all lobes of both lungs compatible with acute pulmonary emboli. Heart is normal size. RV/LV ratio elevated at 1.44 compatible with right heart strain. Aorta normal caliber. Mediastinum/Nodes: No mediastinal, hilar, or axillary adenopathy. Trachea and esophagus are unremarkable. Thyroid unremarkable. Small to moderate-sized hiatal hernia. Lungs/Pleura: Lungs are clear. No focal airspace opacities or suspicious nodules. No effusions. Upper  Abdomen: No acute findings Musculoskeletal: Chest wall soft tissues are unremarkable. No acute bony abnormality. Review of the MIP images confirms the above findings. IMPRESSION: Positive for acute PE with CT evidence of right heart strain (RV/LV Ratio = 1.44) consistent with at least submassive (intermediate risk) PE. The presence of right heart strain has been associated with an increased risk of morbidity and mortality. Please refer to the "Code PE Focused" order set in EPIC. Small to moderate-sized hiatal hernia. Critical Value/emergent results were called by telephone at the time of interpretation on 10/31/2023 at 10:30 pm to provider DAVID [facility] , who verbally acknowledged these results. Electronically Signed   By: Charlett Nose M.D.   On: 10/31/2023 22:32   DG Chest 2 View Result Date: 10/31/2023 CLINICAL DATA:  Chest pain EXAM: CHEST - 2 VIEW COMPARISON:  None Available. FINDINGS: No consolidation, pneumothorax or effusion. No edema. Normal cardiopericardial silhouette. Frontal view is slightly rotated to the  left. Overlapping gown snaps. IMPRESSION: No acute cardiopulmonary disease. Electronically Signed   By: Karen Kays M.D.   On: 10/31/2023 18:21    Assessment/Plan  Pulmonary embolism [facility]) The patient is doing very well status post pulmonary thrombectomy for submassive pulmonary embolus with right heart strain.  Continue anticoagulation for 1 year.  At that time, decision on reduction to prophylactic dose of Eliquis versus aspirin can be made.  We will see her back in 1 year.  Strongly recommended smoking cessation.  Compression socks for any leg swelling.  Continued activity and exercise are important.  Tobacco use disorder We had a discussion for approximately 3 minutes regarding the absolute need for smoking cessation due to the deleterious nature of tobacco on the vascular system. We discussed the tobacco use would diminish patency of any intervention, and likely significantly worsen  progressio of disease. We discussed multiple agents for quitting including replacement therapy. The patient voices their understanding of the importance of smoking cessation.    Festus Barren, MD  11/25/2023 11:50 AM    This note was created with Dragon medical transcription system.  Any errors from dictation are purely unintentional

## 2023-11-25 NOTE — Assessment & Plan Note (Signed)
We had a discussion for approximately 3 minutes regarding the absolute need for smoking cessation due to the deleterious nature of tobacco on the vascular system. We discussed the tobacco use would diminish patency of any intervention, and likely significantly worsen progressio of disease. We discussed multiple agents for quitting including replacement therapy. The patient voices their understanding of the importance of smoking cessation.

## 2023-11-28 ENCOUNTER — Ambulatory Visit: Admission: RE | Admit: 2023-11-28 | Payer: 59 | Source: Home / Self Care

## 2023-11-28 SURGERY — COLONOSCOPY WITH PROPOFOL
Anesthesia: General

## 2024-03-26 ENCOUNTER — Other Ambulatory Visit: Payer: Self-pay | Admitting: Family Medicine

## 2024-03-26 DIAGNOSIS — Z1231 Encounter for screening mammogram for malignant neoplasm of breast: Secondary | ICD-10-CM

## 2024-07-05 ENCOUNTER — Ambulatory Visit
Admission: RE | Admit: 2024-07-05 | Discharge: 2024-07-05 | Disposition: A | Discharge status: Home / Self Care | Source: Ambulatory Visit | Attending: Family Medicine | Admitting: Family Medicine

## 2024-07-05 DIAGNOSIS — Z1231 Encounter for screening mammogram for malignant neoplasm of breast: Secondary | ICD-10-CM | POA: Insufficient documentation

## 2024-11-23 ENCOUNTER — Ambulatory Visit (INDEPENDENT_AMBULATORY_CARE_PROVIDER_SITE_OTHER): Payer: 59 | Admitting: Vascular Surgery

## 2024-12-11 ENCOUNTER — Ambulatory Visit (INDEPENDENT_AMBULATORY_CARE_PROVIDER_SITE_OTHER): Admitting: Vascular Surgery
